# Patient Record
Sex: Female | Born: 1985 | Race: Black or African American | Hispanic: No | Marital: Single | Smoking: Never smoker
Health system: Southern US, Community
[De-identification: ages and names within clinical notes are randomized; demographics above are authoritative.]

## PROBLEM LIST (undated history)

## (undated) DIAGNOSIS — K810 Acute cholecystitis: Principal | ICD-10-CM

## (undated) HISTORY — PX: HERNIA REPAIR: SHX51

## (undated) HISTORY — PX: ABDOMINOPLASTY: SUR9

---
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
## 2010-10-09 ENCOUNTER — Emergency Department (HOSPITAL_BASED_OUTPATIENT_CLINIC_OR_DEPARTMENT_OTHER): Admission: EM | Admit: 2010-10-09 | Discharge: 2010-10-09 | Payer: Self-pay | Admitting: Emergency Medicine

## 2011-03-11 ENCOUNTER — Emergency Department (HOSPITAL_BASED_OUTPATIENT_CLINIC_OR_DEPARTMENT_OTHER)
Admission: EM | Admit: 2011-03-11 | Discharge: 2011-03-11 | Disposition: A | Discharge status: Home / Self Care | Payer: Self-pay | Attending: Emergency Medicine | Admitting: Emergency Medicine

## 2011-03-11 DIAGNOSIS — S335XXA Sprain of ligaments of lumbar spine, initial encounter: Secondary | ICD-10-CM | POA: Insufficient documentation

## 2011-03-11 DIAGNOSIS — X58XXXA Exposure to other specified factors, initial encounter: Secondary | ICD-10-CM | POA: Insufficient documentation

## 2011-03-11 LAB — URINALYSIS, ROUTINE W REFLEX MICROSCOPIC
Bilirubin Urine: NEGATIVE
Hgb urine dipstick: NEGATIVE
Ketones, ur: NEGATIVE mg/dL
Nitrite: NEGATIVE
Specific Gravity, Urine: 1.028 (ref 1.005–1.030)
Urobilinogen, UA: 0.2 mg/dL (ref 0.0–1.0)

## 2011-03-11 LAB — PREGNANCY, URINE: Preg Test, Ur: NEGATIVE

## 2011-11-01 ENCOUNTER — Emergency Department (HOSPITAL_BASED_OUTPATIENT_CLINIC_OR_DEPARTMENT_OTHER)
Admission: EM | Admit: 2011-11-01 | Discharge: 2011-11-01 | Disposition: A | Discharge status: Home / Self Care | Payer: Self-pay | Attending: Emergency Medicine | Admitting: Emergency Medicine

## 2011-11-01 ENCOUNTER — Encounter: Payer: Self-pay | Admitting: *Deleted

## 2011-11-01 ENCOUNTER — Emergency Department (INDEPENDENT_AMBULATORY_CARE_PROVIDER_SITE_OTHER): Payer: Self-pay

## 2011-11-01 DIAGNOSIS — R05 Cough: Secondary | ICD-10-CM | POA: Insufficient documentation

## 2011-11-01 DIAGNOSIS — R059 Cough, unspecified: Secondary | ICD-10-CM | POA: Insufficient documentation

## 2011-11-01 DIAGNOSIS — R0602 Shortness of breath: Secondary | ICD-10-CM | POA: Insufficient documentation

## 2011-11-01 MED ORDER — HYDROCOD POLST-CHLORPHEN POLST 10-8 MG/5ML PO LQCR: 5.0000 mL | Freq: Two times a day (BID) | ORAL | Status: DC

## 2011-11-01 NOTE — ED Provider Notes (Signed)
History     CSN: [id number] Arrival date & time: 11/01/2011  2:06 PM   First MD Initiated Contact with Patient 11/01/11 1443      Chief Complaint  Patient presents with  . Cough    (Consider location/radiation/quality/duration/timing/severity/associated sxs/prior treatment) HPI Comments: Pt states that she was seen and treated and symptoms continue:pt states that she want to make sure that she doesn't need antibiotics  Patient is a 25 y.o. female presenting with cough. The history is provided by the patient. No language interpreter was used.  Cough This is a new problem. The current episode started more than 1 week ago. The problem occurs constantly. The problem has not changed since onset.The cough is productive of sputum. There has been no fever. Associated symptoms include shortness of breath and wheezing. Treatments tried: prednisone and albuterol. She is not a smoker.    History reviewed. No pertinent past medical history.  History reviewed. No pertinent past surgical history.  History reviewed. No pertinent family history.  History  Substance Use Topics  . Smoking status: Never Smoker   . Smokeless tobacco: Not on file  . Alcohol Use: Yes    OB History    Grav Para Term Preterm Abortions TAB SAB Ect Mult Living                  Review of Systems  Respiratory: Positive for cough, shortness of breath and wheezing.   All other systems reviewed and are negative.    Allergies  Review of patient's allergies indicates no known allergies.  Home Medications   Current Outpatient Rx  Name Route Sig Dispense Refill  . ALBUTEROL SULFATE (2.5 MG/3ML) 0.083% IN NEBU Nebulization Take 2.5 mg by nebulization every 6 (six) hours as needed.      Marland Kitchen PREDNISONE (PAK) 10 MG PO TABS Oral Take 10 mg by mouth daily.        BP 124/75  Pulse 89  Temp(Src) 99.4 F (37.4 C) (Oral)  Resp 18  SpO2 100%  Physical Exam  Nursing note and vitals reviewed. Constitutional: She is  oriented to person, place, and time. She appears well-developed and well-nourished.  HENT:  Head: Normocephalic and atraumatic.  Left Ear: External ear normal.  Eyes: Pupils are equal, round, and reactive to light.  Cardiovascular: Normal rate and regular rhythm.   Pulmonary/Chest: Effort normal and breath sounds normal. No respiratory distress. She has no wheezes.  Musculoskeletal: Normal range of motion.  Neurological: She is alert and oriented to person, place, and time.  Skin: Skin is warm and dry.  Psychiatric: She has a normal mood and affect.    ED Course  Procedures (including critical care time)  Labs Reviewed - No data to display Dg Chest 2 View  11/01/2011  *RADIOLOGY REPORT*  Clinical Data: Cough for 3 weeks.  CHEST - 2 VIEW  Comparison: None.  Findings: Two views of the chest demonstrate clear lungs.  Few streaky densities along the posterior heart on the lateral view probably represents overlying structures.  Heart and mediastinum are within normal limits.  The trachea is midline.  Bony structures are intact.  IMPRESSION: No acute chest findings.  Original Report Authenticated By: Richarda Overlie, M.D.     1. Cough       MDM  Symptoms likely viral:pt in no acute distress:will treat symptomatically   Medical screening examination/treatment/procedure(s) were performed by non-physician practitioner and as supervising physician I was immediately available for consultation/collaboration. Jerold Coombe, M.D.  Teressa Lower, NP 11/01/11 1542  Carleene Cooper III, MD 11/01/11 [phone]

## 2011-11-01 NOTE — ED Notes (Signed)
Pt states she has had cough, congestion for 3 weeks. Took OTC meds without relief. Seen at clinic on Monday. Given Prednisone and Albuterol, but states she is no better.

## 2012-02-10 ENCOUNTER — Encounter (HOSPITAL_BASED_OUTPATIENT_CLINIC_OR_DEPARTMENT_OTHER): Payer: Self-pay | Admitting: Emergency Medicine

## 2012-02-10 ENCOUNTER — Emergency Department (HOSPITAL_BASED_OUTPATIENT_CLINIC_OR_DEPARTMENT_OTHER)
Admission: EM | Admit: 2012-02-10 | Discharge: 2012-02-10 | Disposition: A | Discharge status: Home / Self Care | Payer: Self-pay | Attending: Emergency Medicine | Admitting: Emergency Medicine

## 2012-02-10 DIAGNOSIS — K420 Umbilical hernia with obstruction, without gangrene: Secondary | ICD-10-CM | POA: Insufficient documentation

## 2012-02-10 LAB — URINALYSIS, ROUTINE W REFLEX MICROSCOPIC
Glucose, UA: NEGATIVE mg/dL
Hgb urine dipstick: NEGATIVE
Ketones, ur: NEGATIVE mg/dL
Leukocytes, UA: NEGATIVE
Protein, ur: NEGATIVE mg/dL
pH: 7 (ref 5.0–8.0)

## 2012-02-10 NOTE — ED Provider Notes (Signed)
History     CSN: [id number]  Arrival date & time 02/10/12  2113   First MD Initiated Contact with Patient 02/10/12 2211      Chief Complaint  Patient presents with  . Abdominal Pain    (Consider location/radiation/quality/duration/timing/severity/associated sxs/prior treatment) Patient is a 26 y.o. female presenting with abdominal pain. The history is provided by the patient.  Abdominal Pain The primary symptoms of the illness include abdominal pain. The primary symptoms of the illness do not include fever, nausea, vomiting, dysuria or vaginal discharge. The current episode started 1 to 2 hours ago. The onset of the illness was sudden. The problem has not changed since onset. The pain came on suddenly. The abdominal pain has been unchanged since its onset. Pain Location: Umbilical. She states that is a hard knot in her bellybutton. The abdominal pain does not radiate. The severity of the abdominal pain is 9/10. The abdominal pain is relieved by nothing.  The patient states that she believes she is currently not pregnant. Symptoms associated with the illness do not include anorexia or constipation. Significant associated medical issues do not include PUD, GERD, diabetes or gallstones.    History reviewed. No pertinent past medical history.  History reviewed. No pertinent past surgical history.  No family history on file.  History  Substance Use Topics  . Smoking status: Never Smoker   . Smokeless tobacco: Not on file  . Alcohol Use: Yes    OB History    Grav Para Term Preterm Abortions TAB SAB Ect Mult Living                  Review of Systems  Constitutional: Negative for fever.  Gastrointestinal: Positive for abdominal pain. Negative for nausea, vomiting, constipation and anorexia.  Genitourinary: Negative for dysuria and vaginal discharge.  All other systems reviewed and are negative.    Allergies  Review of patient's allergies indicates no known allergies.  Home  Medications   Current Outpatient Rx  Name Route Sig Dispense Refill  . LEVONORGESTREL 20 MCG/24HR IU IUD Intrauterine 1 each by Intrauterine route once. Inserted July 2010      BP 129/76  Pulse 90  Temp(Src) 98.4 F (36.9 C) (Oral)  Resp 18  Wt 162 lb (73.483 kg)  SpO2 100%  Physical Exam  Nursing note and vitals reviewed. Constitutional: She is oriented to person, place, and time. She appears well-developed and well-nourished. She appears distressed.  HENT:  Head: Normocephalic and atraumatic.  Eyes: EOM are normal. Pupils are equal, round, and reactive to light.  Pulmonary/Chest: Effort normal and breath sounds normal.  Abdominal: Soft. Bowel sounds are normal. She exhibits no distension. There is tenderness. There is no rebound and no guarding. A hernia is present.       Incarcerated umbilical hernia which was just with gentle pressure. Upon reduction all the pain resolved and no longer having abdominal  Musculoskeletal: Normal range of motion. She exhibits no tenderness.       No edema  Neurological: She is alert and oriented to person, place, and time. No cranial nerve deficit.  Skin: Skin is warm and dry. No rash noted.  Psychiatric: She has a normal mood and affect. Her behavior is normal.    ED Course  Hernia reduction Date/Time: 02/11/2012 12:19 AM Performed by: Gwyneth Sprout Authorized by: Gwyneth Sprout Consent: Verbal consent obtained. Written consent not obtained. Consent given by: patient Patient identity confirmed: verbally with patient Local anesthesia used: no Patient sedated: no  Patient tolerance: Patient tolerated the procedure well with no immediate complications. Comments: Umbilical hernia reduced and pain resolved   (including critical care time)   Labs Reviewed  PREGNANCY, URINE  URINALYSIS, ROUTINE W REFLEX MICROSCOPIC   No results found.   No diagnosis found.    MDM   Patient with incarcerated umbilical hernia. Hernia was  reduced and pain resolved. Patient was given followup with Gen. Surgery.        Gwyneth Sprout, MD 02/11/12 0020

## 2012-02-10 NOTE — ED Notes (Signed)
Pt c/o periumbilical pain x 1 hour. Pt denies n/v/d.

## 2012-07-25 ENCOUNTER — Inpatient Hospital Stay (HOSPITAL_BASED_OUTPATIENT_CLINIC_OR_DEPARTMENT_OTHER)
Admission: EM | Admit: 2012-07-25 | Discharge: 2012-07-26 | DRG: 494 | Disposition: A | Discharge status: Home / Self Care | Payer: BC Managed Care – PPO | Attending: Surgery | Admitting: Surgery

## 2012-07-25 ENCOUNTER — Emergency Department (HOSPITAL_BASED_OUTPATIENT_CLINIC_OR_DEPARTMENT_OTHER): Payer: BC Managed Care – PPO

## 2012-07-25 ENCOUNTER — Inpatient Hospital Stay (HOSPITAL_COMMUNITY): Payer: BC Managed Care – PPO | Admitting: Anesthesiology

## 2012-07-25 ENCOUNTER — Encounter (HOSPITAL_BASED_OUTPATIENT_CLINIC_OR_DEPARTMENT_OTHER): Payer: Self-pay | Admitting: Emergency Medicine

## 2012-07-25 ENCOUNTER — Inpatient Hospital Stay (HOSPITAL_COMMUNITY): Payer: BC Managed Care – PPO

## 2012-07-25 ENCOUNTER — Encounter (HOSPITAL_COMMUNITY): Admission: EM | Disposition: A | Discharge status: Home / Self Care | Payer: Self-pay | Source: Home / Self Care

## 2012-07-25 ENCOUNTER — Encounter (HOSPITAL_COMMUNITY): Payer: Self-pay | Admitting: Anesthesiology

## 2012-07-25 DIAGNOSIS — K8010 Calculus of gallbladder with chronic cholecystitis without obstruction: Secondary | ICD-10-CM

## 2012-07-25 DIAGNOSIS — B373 Candidiasis of vulva and vagina: Secondary | ICD-10-CM

## 2012-07-25 DIAGNOSIS — K429 Umbilical hernia without obstruction or gangrene: Secondary | ICD-10-CM | POA: Diagnosis present

## 2012-07-25 DIAGNOSIS — K810 Acute cholecystitis: Principal | ICD-10-CM

## 2012-07-25 HISTORY — PX: UMBILICAL HERNIA REPAIR: SHX196

## 2012-07-25 HISTORY — PX: CHOLECYSTECTOMY: SHX55

## 2012-07-25 HISTORY — DX: Acute cholecystitis: K81.0

## 2012-07-25 LAB — SURGICAL PCR SCREEN: Staphylococcus aureus: POSITIVE — AB

## 2012-07-25 LAB — URINALYSIS, ROUTINE W REFLEX MICROSCOPIC
Glucose, UA: NEGATIVE mg/dL
Nitrite: NEGATIVE
Specific Gravity, Urine: 1.03 (ref 1.005–1.030)
pH: 6 (ref 5.0–8.0)

## 2012-07-25 LAB — CBC WITH DIFFERENTIAL/PLATELET
Hemoglobin: 12.7 g/dL (ref 12.0–15.0)
Lymphocytes Relative: 25 % (ref 12–46)
Lymphs Abs: 3.4 10*3/uL (ref 0.7–4.0)
Monocytes Relative: 9 % (ref 3–12)
Neutro Abs: 8.9 10*3/uL — ABNORMAL HIGH (ref 1.7–7.7)
Neutrophils Relative %: 65 % (ref 43–77)
Platelets: 301 10*3/uL (ref 150–400)
RBC: 4.37 MIL/uL (ref 3.87–5.11)
WBC: 13.7 10*3/uL — ABNORMAL HIGH (ref 4.0–10.5)

## 2012-07-25 LAB — COMPREHENSIVE METABOLIC PANEL
ALT: 18 U/L (ref 0–35)
Alkaline Phosphatase: 68 U/L (ref 39–117)
CO2: 26 mEq/L (ref 19–32)
Chloride: 101 mEq/L (ref 96–112)
GFR calc Af Amer: >90 mL/min (ref >90)
GFR calc non Af Amer: 89 mL/min — ABNORMAL LOW (ref >90)
Glucose, Bld: 105 mg/dL — ABNORMAL HIGH (ref 70–99)
Potassium: 4 mEq/L (ref 3.5–5.1)
Sodium: 137 mEq/L (ref 135–145)
Total Bilirubin: 0.2 mg/dL — ABNORMAL LOW (ref 0.3–1.2)
Total Protein: 7.8 g/dL (ref 6.0–8.3)

## 2012-07-25 LAB — URINE MICROSCOPIC-ADD ON

## 2012-07-25 LAB — WET PREP, GENITAL
Clue Cells Wet Prep HPF POC: NONE SEEN
Trich, Wet Prep: NONE SEEN

## 2012-07-25 SURGERY — LAPAROSCOPIC CHOLECYSTECTOMY WITH INTRAOPERATIVE CHOLANGIOGRAM
Anesthesia: General | Site: Abdomen | Wound class: Clean Contaminated

## 2012-07-25 MED ORDER — MORPHINE SULFATE 2 MG/ML IJ SOLN: 1.0000 mg | INTRAMUSCULAR | Status: DC | PRN

## 2012-07-25 MED ORDER — DEXAMETHASONE SODIUM PHOSPHATE 10 MG/ML IJ SOLN
INTRAMUSCULAR | Status: DC | PRN
  Administered 2012-07-25: 10 mg via INTRAVENOUS

## 2012-07-25 MED ORDER — ONDANSETRON HCL 4 MG/2ML IJ SOLN: 4.0000 mg | Freq: Four times a day (QID) | INTRAMUSCULAR | Status: DC | PRN

## 2012-07-25 MED ORDER — IOHEXOL 300 MG/ML  SOLN
20.0000 mL | INTRAMUSCULAR | Status: DC
  Administered 2012-07-25: 20 mL via ORAL

## 2012-07-25 MED ORDER — ONDANSETRON HCL 4 MG/2ML IJ SOLN
INTRAMUSCULAR | Status: DC | PRN
  Administered 2012-07-25: 4 mg via INTRAVENOUS

## 2012-07-25 MED ORDER — CISATRACURIUM BESYLATE (PF) 10 MG/5ML IV SOLN
INTRAVENOUS | Status: DC | PRN
  Administered 2012-07-25: 8 mg via INTRAVENOUS

## 2012-07-25 MED ORDER — GLYCOPYRROLATE 0.2 MG/ML IJ SOLN
INTRAMUSCULAR | Status: DC | PRN
  Administered 2012-07-25: .5 mg via INTRAVENOUS

## 2012-07-25 MED ORDER — SODIUM CHLORIDE 0.9 % IV SOLN
INTRAVENOUS | Status: DC
  Administered 2012-07-25: 05:00:00 via INTRAVENOUS

## 2012-07-25 MED ORDER — HYDROMORPHONE HCL PF 1 MG/ML IJ SOLN
0.2500 mg | INTRAMUSCULAR | Status: DC | PRN
  Administered 2012-07-25 – 2012-07-26 (×5): 0.5 mg via INTRAVENOUS
  Filled 2012-07-25 (×3): qty 1

## 2012-07-25 MED ORDER — BUPIVACAINE HCL (PF) 0.25 % IJ SOLN
INTRAMUSCULAR | Status: DC | PRN
  Administered 2012-07-25: 15 mL

## 2012-07-25 MED ORDER — SODIUM CHLORIDE 0.9 % IR SOLN
Status: DC | PRN
  Administered 2012-07-25: 1000 mL

## 2012-07-25 MED ORDER — IOHEXOL 300 MG/ML  SOLN
100.0000 mL | Freq: Once | INTRAMUSCULAR | Status: AC | PRN
  Administered 2012-07-25: 100 mL via INTRAVENOUS

## 2012-07-25 MED ORDER — LIDOCAINE-EPINEPHRINE 1 %-1:100000 IJ SOLN
INTRAMUSCULAR | Status: AC
  Filled 2012-07-25: qty 1

## 2012-07-25 MED ORDER — LEVOFLOXACIN IN D5W 500 MG/100ML IV SOLN
500.0000 mg | INTRAVENOUS | Status: DC
  Administered 2012-07-25: 500 mg via INTRAVENOUS
  Filled 2012-07-25 (×2): qty 100

## 2012-07-25 MED ORDER — MIDAZOLAM HCL 5 MG/5ML IJ SOLN
INTRAMUSCULAR | Status: DC | PRN
  Administered 2012-07-25: 2 mg via INTRAVENOUS

## 2012-07-25 MED ORDER — ONDANSETRON HCL 4 MG/2ML IJ SOLN
4.0000 mg | Freq: Once | INTRAMUSCULAR | Status: AC
  Administered 2012-07-25: 4 mg via INTRAVENOUS
  Filled 2012-07-25: qty 2

## 2012-07-25 MED ORDER — PROMETHAZINE HCL 25 MG/ML IJ SOLN: 6.2500 mg | INTRAMUSCULAR | Status: DC | PRN

## 2012-07-25 MED ORDER — LIDOCAINE-EPINEPHRINE (PF) 1 %-1:200000 IJ SOLN
INTRAMUSCULAR | Status: DC | PRN
  Administered 2012-07-25: 15 mL

## 2012-07-25 MED ORDER — SODIUM CHLORIDE 0.9 % IV SOLN
INTRAVENOUS | Status: DC | PRN
  Administered 2012-07-25: 18:00:00 via INTRAVENOUS

## 2012-07-25 MED ORDER — BUPIVACAINE HCL (PF) 0.25 % IJ SOLN
INTRAMUSCULAR | Status: AC
  Filled 2012-07-25: qty 30

## 2012-07-25 MED ORDER — IOHEXOL 300 MG/ML  SOLN
INTRAMUSCULAR | Status: AC
  Filled 2012-07-25: qty 1

## 2012-07-25 MED ORDER — KCL IN DEXTROSE-NACL 20-5-0.45 MEQ/L-%-% IV SOLN
INTRAVENOUS | Status: DC
  Filled 2012-07-25 (×3): qty 1000

## 2012-07-25 MED ORDER — LACTATED RINGERS IV SOLN
INTRAVENOUS | Status: DC | PRN
  Administered 2012-07-25: 18:00:00 via INTRAVENOUS

## 2012-07-25 MED ORDER — PROPOFOL 10 MG/ML IV EMUL
INTRAVENOUS | Status: DC | PRN
  Administered 2012-07-25: 150 mg via INTRAVENOUS

## 2012-07-25 MED ORDER — FENTANYL CITRATE 0.05 MG/ML IJ SOLN
INTRAMUSCULAR | Status: DC | PRN
  Administered 2012-07-25: 50 ug via INTRAVENOUS
  Administered 2012-07-25: 100 ug via INTRAVENOUS

## 2012-07-25 MED ORDER — LIP MEDEX EX OINT
TOPICAL_OINTMENT | CUTANEOUS | Status: AC
  Administered 2012-07-25: 22:00:00
  Filled 2012-07-25: qty 7

## 2012-07-25 MED ORDER — FENTANYL CITRATE 0.05 MG/ML IJ SOLN
100.0000 ug | Freq: Once | INTRAMUSCULAR | Status: AC
  Administered 2012-07-25: 100 ug via INTRAVENOUS
  Filled 2012-07-25: qty 2

## 2012-07-25 MED ORDER — KCL IN DEXTROSE-NACL 20-5-0.45 MEQ/L-%-% IV SOLN
INTRAVENOUS | Status: DC
  Administered 2012-07-25 – 2012-07-26 (×2): via INTRAVENOUS
  Filled 2012-07-25 (×3): qty 1000

## 2012-07-25 MED ORDER — MORPHINE SULFATE 2 MG/ML IJ SOLN
2.0000 mg | INTRAMUSCULAR | Status: DC | PRN
Start: 2012-07-25 — End: 2012-07-25

## 2012-07-25 MED ORDER — FENTANYL CITRATE 0.05 MG/ML IJ SOLN: 100.0000 ug | INTRAMUSCULAR | Status: DC | PRN

## 2012-07-25 MED ORDER — NEOSTIGMINE METHYLSULFATE 1 MG/ML IJ SOLN
INTRAMUSCULAR | Status: DC | PRN
  Administered 2012-07-25: 4 mg via INTRAVENOUS

## 2012-07-25 MED ORDER — HYDROCODONE-ACETAMINOPHEN 5-325 MG PO TABS: 1.0000 | ORAL_TABLET | Freq: Four times a day (QID) | ORAL | Status: DC | PRN

## 2012-07-25 MED ORDER — FLUCONAZOLE 50 MG PO TABS
150.0000 mg | ORAL_TABLET | Freq: Once | ORAL | Status: AC
  Administered 2012-07-25: 150 mg via ORAL
  Filled 2012-07-25: qty 1

## 2012-07-25 MED ORDER — LACTATED RINGERS IR SOLN
Status: DC | PRN
  Administered 2012-07-25: 1000 mL

## 2012-07-25 SURGICAL SUPPLY — 37 items
APPLICATOR COTTON TIP 6IN STRL (MISCELLANEOUS) IMPLANT
APPLIER CLIP LOGIC TI 5 (MISCELLANEOUS) ×2 IMPLANT
APPLIER CLIP ROT 10 11.4 M/L (STAPLE) ×2
CABLE HIGH FREQUENCY MONO STRZ (ELECTRODE) ×2 IMPLANT
CANISTER SUCTION 2500CC (MISCELLANEOUS) ×2 IMPLANT
CHLORAPREP W/TINT 26ML (MISCELLANEOUS) ×2 IMPLANT
CLIP APPLIE ROT 10 11.4 M/L (STAPLE) ×1 IMPLANT
CLOTH BEACON ORANGE TIMEOUT ST (SAFETY) ×2 IMPLANT
COVER SURGICAL LIGHT HANDLE (MISCELLANEOUS) ×2 IMPLANT
DECANTER SPIKE VIAL GLASS SM (MISCELLANEOUS) ×2 IMPLANT
DERMABOND ADVANCED (GAUZE/BANDAGES/DRESSINGS) ×1
DERMABOND ADVANCED .7 DNX12 (GAUZE/BANDAGES/DRESSINGS) ×1 IMPLANT
DRAPE C-ARM 42X72 X-RAY (DRAPES) ×2 IMPLANT
DRAPE LAPAROSCOPIC ABDOMINAL (DRAPES) ×2 IMPLANT
ELECT REM PT RETURN 9FT ADLT (ELECTROSURGICAL) ×2
ELECTRODE REM PT RTRN 9FT ADLT (ELECTROSURGICAL) ×1 IMPLANT
ENDOLOOP SUT PDS II  0 18 (SUTURE)
ENDOLOOP SUT PDS II 0 18 (SUTURE) IMPLANT
GLOVE SURG SS PI 7.5 STRL IVOR (GLOVE) ×4 IMPLANT
GOWN STRL NON-REIN LRG LVL3 (GOWN DISPOSABLE) ×2 IMPLANT
GOWN STRL REIN XL XLG (GOWN DISPOSABLE) ×4 IMPLANT
KIT BASIN OR (CUSTOM PROCEDURE TRAY) ×2 IMPLANT
NS IRRIG 1000ML POUR BTL (IV SOLUTION) ×2 IMPLANT
PENCIL BUTTON HOLSTER BLD 10FT (ELECTRODE) ×2 IMPLANT
POUCH SPECIMEN RETRIEVAL 10MM (ENDOMECHANICALS) ×2 IMPLANT
SCISSORS LAP 5X35 DISP (ENDOMECHANICALS) ×2 IMPLANT
SET CHOLANGIOGRAPH MIX (MISCELLANEOUS) ×2 IMPLANT
SET IRRIG TUBING LAPAROSCOPIC (IRRIGATION / IRRIGATOR) ×2 IMPLANT
STRIP CLOSURE SKIN 1/2X4 (GAUZE/BANDAGES/DRESSINGS) IMPLANT
SUT MNCRL AB 4-0 PS2 18 (SUTURE) ×4 IMPLANT
SUT VICRYL 0 UR6 27IN ABS (SUTURE) ×4 IMPLANT
TOWEL OR 17X26 10 PK STRL BLUE (TOWEL DISPOSABLE) ×4 IMPLANT
TRAY LAP CHOLE (CUSTOM PROCEDURE TRAY) ×2 IMPLANT
TROCAR BALLN 12MMX100 BLUNT (TROCAR) ×2 IMPLANT
TROCAR BLADELESS OPT 5 75 (ENDOMECHANICALS) ×4 IMPLANT
TROCAR XCEL NON-BLD 11X100MML (ENDOMECHANICALS) ×2 IMPLANT
TUBING INSUFFLATION 10FT LAP (TUBING) ×2 IMPLANT

## 2012-07-25 NOTE — ED Notes (Signed)
States she knows she has a known umbilical hernia and has hx of low back spasms.  C/o pain last night around 10pm in area of hernia and low back.  Denies any UTI sx's or vaginal d/c.

## 2012-07-25 NOTE — H&P (Signed)
Reason for Consult:cholecystitis Referring Physician:  Ghim  Linda Pena is an 26 y.o. female.  HPI: this patient was in her usual state of health until approximately 10:00 last night she began having back pain and periumbilical pain. She described this as a "bad ache" the severity of 9 and 10. She thought this would go away but it persisted and she presented to med center [facility] for evaluation. She had some nausea while at the emergency room as well as an episode of vomiting but she was not feeling nauseated previously. The ordered a CT scan of the abdomen which demonstrated cholelithiasis with some gallbladder wall thickening and some pericholecystic fluid and a followup ultrasound was obtained which confirmed the diagnosis. She says that she has not been having abdominal pain and has not noticed any change with bleeding. She says her bowels are normal  And denies any blood in the stool or melena. She denies any fevers or chills or urinary symptoms.  History reviewed. No pertinent past medical history.  History reviewed. No pertinent past surgical history.  No family history on file.  Social History:  reports that she has never smoked. She does not have any smokeless tobacco history on file. She reports that she drinks alcohol. She reports that she does not use illicit drugs. she does smoke drugs about 2/week and she drinks on the weekends  Allergies: No Known Allergies  Medications: I have reviewed the patient's current medications.  Results for orders placed during the hospital encounter of 07/25/12 (from the past 48 hour(s))  URINALYSIS, ROUTINE W REFLEX MICROSCOPIC     Status: Abnormal   Collection Time   07/25/12  5:05 AM      Component Value Range Comment   Color, Urine YELLOW  YELLOW    APPearance CLEAR  CLEAR    Specific Gravity, Urine 1.030  1.005 - 1.030    pH 6.0  5.0 - 8.0    Glucose, UA NEGATIVE  NEGATIVE mg/dL    Hgb urine dipstick NEGATIVE  NEGATIVE    Bilirubin  Urine NEGATIVE  NEGATIVE    Ketones, ur NEGATIVE  NEGATIVE mg/dL    Protein, ur NEGATIVE  NEGATIVE mg/dL    Urobilinogen, UA 0.2  0.0 - 1.0 mg/dL    Nitrite NEGATIVE  NEGATIVE    Leukocytes, UA SMALL (*) NEGATIVE   PREGNANCY, URINE     Status: Normal   Collection Time   07/25/12  5:05 AM      Component Value Range Comment   Preg Test, Ur NEGATIVE  NEGATIVE   URINE MICROSCOPIC-ADD ON     Status: Abnormal   Collection Time   07/25/12  5:05 AM      Component Value Range Comment   Squamous Epithelial / LPF FEW (*) RARE    WBC, UA 0-2  <3 WBC/hpf    RBC / HPF 0-2  <3 RBC/hpf    Bacteria, UA FEW (*) RARE    Urine-Other MUCOUS PRESENT     CBC WITH DIFFERENTIAL     Status: Abnormal   Collection Time   07/25/12  5:13 AM      Component Value Range Comment   WBC 13.7 (*) 4.0 - 10.5 K/uL    RBC 4.37  3.87 - 5.11 MIL/uL    Hemoglobin 12.7  12.0 - 15.0 g/dL    HCT 16.1  09.6 - 04.5 %    MCV 85.8  78.0 - 100.0 fL    MCH 29.1  26.0 -  34.0 pg    MCHC 33.9  30.0 - 36.0 g/dL    RDW 29.5  62.1 - 30.8 %    Platelets 301  150 - 400 K/uL    Neutrophils Relative 65  43 - 77 %    Neutro Abs 8.9 (*) 1.7 - 7.7 K/uL    Lymphocytes Relative 25  12 - 46 %    Lymphs Abs 3.4  0.7 - 4.0 K/uL    Monocytes Relative 9  3 - 12 %    Monocytes Absolute 1.2 (*) 0.1 - 1.0 K/uL    Eosinophils Relative 1  0 - 5 %    Eosinophils Absolute 0.2  0.0 - 0.7 K/uL    Basophils Relative 0  0 - 1 %    Basophils Absolute 0.0  0.0 - 0.1 K/uL   COMPREHENSIVE METABOLIC PANEL     Status: Abnormal   Collection Time   07/25/12  5:13 AM      Component Value Range Comment   Sodium 137  135 - 145 mEq/L    Potassium 4.0  3.5 - 5.1 mEq/L    Chloride 101  96 - 112 mEq/L    CO2 26  19 - 32 mEq/L    Glucose, Bld 105 (*) 70 - 99 mg/dL    BUN 17  6 - 23 mg/dL    Creatinine, Ser 6.57  0.50 - 1.10 mg/dL    Calcium 9.2  8.4 - 84.6 mg/dL    Total Protein 7.8  6.0 - 8.3 g/dL    Albumin 4.1  3.5 - 5.2 g/dL    AST 20  0 - 37 U/L    ALT 18   0 - 35 U/L    Alkaline Phosphatase 68  39 - 117 U/L    Total Bilirubin 0.2 (*) 0.3 - 1.2 mg/dL    GFR calc non Af Amer 89 (*) >90 mL/min    GFR calc Af Amer >90  >90 mL/min   LIPASE, BLOOD     Status: Normal   Collection Time   07/25/12  5:13 AM      Component Value Range Comment   Lipase 42  11 - 59 U/L   WET PREP, GENITAL     Status: Abnormal   Collection Time   07/25/12  5:53 AM      Component Value Range Comment   Yeast Wet Prep HPF POC MANY (*) NONE SEEN    Trich, Wet Prep NONE SEEN  NONE SEEN    Clue Cells Wet Prep HPF POC NONE SEEN  NONE SEEN    WBC, Wet Prep HPF POC MANY (*) NONE SEEN     US Abdomen Complete  07/25/2012  *RADIOLOGY REPORT*  Clinical Data:  26 year old female with abdominal pain.  Abnormal- appearing gallbladder on recent CT.  ABDOMINAL ULTRASOUND COMPLETE  Comparison:  07/25/2012 CT  Findings:  Gallbladder:  A 1.7 cm gallstone at the gallbladder neck is noted. Gallbladder wall thickening is present without pericholecystic fluid.  An equivocal sonographic Murphy's sign is noted.  Common Bile Duct:  There is no evidence of intrahepatic or extrahepatic biliary dilation. The CBD measures 2.9 mm in greatest diameter.  Liver:  The liver is within normal limits in parenchymal echogenicity. No focal abnormalities are identified.  IVC:  Appears normal.  Pancreas:  Although the pancreas is difficult to visualize in its entirety, no focal pancreatic abnormality is identified.  Spleen:  Within normal limits in size and echotexture.  Right  kidney:  The right kidney is normal in size and parenchymal echogenicity.  There is no evidence of solid mass, hydronephrosis or definite renal calculi.  The right kidney measures 9.5 cm.  Left kidney:  The left kidney is normal in size and parenchymal echogenicity.  There is no evidence of solid mass, hydronephrosis or definite renal calculi.   The left kidney measures 9.9 cm.  Abdominal Aorta:  No abdominal aortic aneurysm identified.  There is no  evidence of ascites.  IMPRESSION: 1.7 cm gallstone at the gallbladder neck with gallbladder wall thickening - likely representing acute cholecystitis. No evidence of biliary dilatation.  No other abnormalities identified.   Original Report Authenticated By: Rosendo Gros, M.D.    Ct Abdomen Pelvis W Contrast  07/25/2012  *RADIOLOGY REPORT*  Clinical Data: Abdominal and back pain.  CT ABDOMEN AND PELVIS WITH CONTRAST  Technique:  Multidetector CT imaging of the abdomen and pelvis was performed following the standard protocol during bolus administration of intravenous contrast.  Contrast: OMNIPAQUE IOHEXOL 300 MG/ML  SOLN  Comparison: None.  Findings: The lung bases are clear.  There is no evidence for free intraperitoneal air.  The gallbladder is moderately distended with fluid around the gallbladder.  There is a dense calcification near the gallbladder neck consistent with a stone. Normal appearance of liver and portal venous system.  Pancreas has a normal appearance without pancreatic duct dilatation.  Normal appearance of the adrenal tissue and kidneys.  Spleen has a normal appearance.  The patient has an IUD. No gross abnormality to the uterus or adnexal tissue.  No significant pelvic free fluid.  No acute bony abnormality.  IMPRESSION: The gallbladder is moderately distended with surrounding edema and a calcified stone near the gallbladder neck.  Findings are concerning for acute cholecystitis.   Original Report Authenticated By: Richarda Overlie, M.D.    All other review of systems negative or noncontributory except as stated in the HPI  Blood pressure 97/57, pulse 70, temperature 98.3 F (36.8 C), temperature source Oral, resp. rate 16, height 5\' 1"  (1.549 m), weight 162 lb (73.483 kg), SpO2 100.00%. General appearance: alert, cooperative and no distress Head: Normocephalic, without obvious abnormality, atraumatic Neck: no JVD and supple, symmetrical, trachea midline Resp: clear to auscultation  bilaterally Cardio: normal rate, regular GI: soft, non-tender; bowel sounds normal; no masses,  no organomegaly and no Murphy's sign, no peritoneal signs.  small reducible umbilical hernia  Extremities: extremities normal, atraumatic, no cyanosis or edema Pulses: 2+ and symmetric Skin: Skin color, texture, turgor normal. No rashes or lesions Neurologic: Grossly normal  Assessment/Plan: Cholelithiasis and possible cholecystitis Donor history and physical exam are not really consistent with acute cholecystitis, she has CT and ultrasound findings consistent with acute cholecystitis. This all started with back pain and some abdominal pain which certainly could be related to cholelithiasis. Currently she is not having a discomfort. She does have a white blood cell count of 13.7 and pericholecystic fluid as well as cholelithiasis. I explained to her that note her symptoms are not consistent with cholecystitis and are certainly atypical, given the findings on imaging as well as her elevated white blood cell count and known cholelithiasis, I think it is reasonable to proceed with cholecystectomy. We discussed the procedure and its risks. The risks of infection, bleeding, pain, persistent symptoms, scarring, injury to bowel or bile ducts, retained stone, diarrhea, need for additional procedures, and need for open surgery discussed with the patient. She expressed understanding and would like  to proceed with cholecystectomy when available. In the meantime, we will admit her to the hospital and keep her n.p.o. With IV fluids and start antibiotic management.   Lodema Pilot DAVID 07/25/2012, 2:45 PM

## 2012-07-25 NOTE — ED Provider Notes (Signed)
History     CSN: [id number]  Arrival date & time 07/25/12  0456   First MD Initiated Contact with Patient 07/25/12 [phone]      Chief Complaint  Patient presents with  . Back Pain    (Consider location/radiation/quality/duration/timing/severity/associated sxs/prior treatment) HPI This is a 26 year old black female with about a 7 hour history of mid lower back pain. The pain has become severe. She describes it as like spasms. It is not altered with movement and improved with lying still. She has had nausea but no vomiting due to the pain. She is also having suprapubic and periumbilical pain. She has a known umbilical hernia which she can normally reduce; she states that there is no palpable hernia to reduce at this time. She denies fever or chills. She denies diarrhea, dysuria, hematuria, vaginal bleeding or discharge.  History reviewed. No pertinent past medical history.  History reviewed. No pertinent past surgical history.  No family history on file.  History  Substance Use Topics  . Smoking status: Never Smoker   . Smokeless tobacco: Not on file  . Alcohol Use: Yes    OB History    Grav Para Term Preterm Abortions TAB SAB Ect Mult Living                  Review of Systems  All other systems reviewed and are negative.    Allergies  Review of patient's allergies indicates no known allergies.  Home Medications   Current Outpatient Rx  Name Route Sig Dispense Refill  . LEVONORGESTREL 20 MCG/24HR IU IUD Intrauterine 1 each by Intrauterine route once. Inserted July 2010      BP 115/68  Pulse 79  Temp 98 F (36.7 C) (Oral)  Resp 16  Ht 5\' 1"  (1.549 m)  Wt 162 lb (73.483 kg)  BMI 30.61 kg/m2  SpO2 100%  Physical Exam General: Well-developed, well-nourished female in no acute distress; appearance consistent with age of record HENT: normocephalic, atraumatic Eyes: pupils equal round and reactive to light; extraocular muscles intact Neck: supple Heart: regular  rate and rhythm Lungs: clear to auscultation bilaterally Abdomen: soft; nondistended; mild periumbilical and suprapubic tenderness; no masses or hepatosplenomegaly; bowel sounds present; no umbilical hernia palpated GU: Normal external genitalia; no vaginal bleeding; curd-like vaginal discharge; cervical os closed; no cervical motion tenderness; no adnexal tenderness; no adnexal mass palpated Back: No lumbar tenderness Extremities: No deformity; full range of motion Neurologic: Awake, alert and oriented; motor function intact in all extremities and symmetric; no facial droop Skin: Warm and dry    ED Course  Procedures (including critical care time)     MDM   Nursing notes and vitals signs, including pulse oximetry, reviewed.  Summary of this visit's results, reviewed by myself:  Labs:  Results for orders placed during the hospital encounter of 07/25/12  URINALYSIS, ROUTINE W REFLEX MICROSCOPIC      Component Value Range   Color, Urine YELLOW  YELLOW   APPearance CLEAR  CLEAR   Specific Gravity, Urine 1.030  1.005 - 1.030   pH 6.0  5.0 - 8.0   Glucose, UA NEGATIVE  NEGATIVE mg/dL   Hgb urine dipstick NEGATIVE  NEGATIVE   Bilirubin Urine NEGATIVE  NEGATIVE   Ketones, ur NEGATIVE  NEGATIVE mg/dL   Protein, ur NEGATIVE  NEGATIVE mg/dL   Urobilinogen, UA 0.2  0.0 - 1.0 mg/dL   Nitrite NEGATIVE  NEGATIVE   Leukocytes, UA SMALL (*) NEGATIVE  PREGNANCY, URINE  Component Value Range   Preg Test, Ur NEGATIVE  NEGATIVE  CBC WITH DIFFERENTIAL      Component Value Range   WBC 13.7 (*) 4.0 - 10.5 K/uL   RBC 4.37  3.87 - 5.11 MIL/uL   Hemoglobin 12.7  12.0 - 15.0 g/dL   HCT 16.1  09.6 - 04.5 %   MCV 85.8  78.0 - 100.0 fL   MCH 29.1  26.0 - 34.0 pg   MCHC 33.9  30.0 - 36.0 g/dL   RDW 40.9  81.1 - 91.4 %   Platelets 301  150 - 400 K/uL   Neutrophils Relative 65  43 - 77 %   Neutro Abs 8.9 (*) 1.7 - 7.7 K/uL   Lymphocytes Relative 25  12 - 46 %   Lymphs Abs 3.4  0.7 - 4.0  K/uL   Monocytes Relative 9  3 - 12 %   Monocytes Absolute 1.2 (*) 0.1 - 1.0 K/uL   Eosinophils Relative 1  0 - 5 %   Eosinophils Absolute 0.2  0.0 - 0.7 K/uL   Basophils Relative 0  0 - 1 %   Basophils Absolute 0.0  0.0 - 0.1 K/uL  COMPREHENSIVE METABOLIC PANEL      Component Value Range   Sodium 137  135 - 145 mEq/L   Potassium 4.0  3.5 - 5.1 mEq/L   Chloride 101  96 - 112 mEq/L   CO2 26  19 - 32 mEq/L   Glucose, Bld 105 (*) 70 - 99 mg/dL   BUN 17  6 - 23 mg/dL   Creatinine, Ser 7.82  0.50 - 1.10 mg/dL   Calcium 9.2  8.4 - 95.6 mg/dL   Total Protein 7.8  6.0 - 8.3 g/dL   Albumin 4.1  3.5 - 5.2 g/dL   AST 20  0 - 37 U/L   ALT 18  0 - 35 U/L   Alkaline Phosphatase 68  39 - 117 U/L   Total Bilirubin 0.2 (*) 0.3 - 1.2 mg/dL   GFR calc non Af Amer 89 (*) >90 mL/min   GFR calc Af Amer >90  >90 mL/min  LIPASE, BLOOD      Component Value Range   Lipase 42  11 - 59 U/L  URINE MICROSCOPIC-ADD ON      Component Value Range   Squamous Epithelial / LPF FEW (*) RARE   WBC, UA 0-2  <3 WBC/hpf   RBC / HPF 0-2  <3 RBC/hpf   Bacteria, UA FEW (*) RARE   Urine-Other MUCOUS PRESENT    WET PREP, GENITAL      Component Value Range   Yeast Wet Prep HPF POC MANY (*) NONE SEEN   Trich, Wet Prep NONE SEEN  NONE SEEN   Clue Cells Wet Prep HPF POC NONE SEEN  NONE SEEN   WBC, Wet Prep HPF POC MANY (*) NONE SEEN    Imaging Studies: Ct Abdomen Pelvis W Contrast  07/25/2012  *RADIOLOGY REPORT*  Clinical Data: Abdominal and back pain.  CT ABDOMEN AND PELVIS WITH CONTRAST  Technique:  Multidetector CT imaging of the abdomen and pelvis was performed following the standard protocol during bolus administration of intravenous contrast.  Contrast: OMNIPAQUE IOHEXOL 300 MG/ML  SOLN  Comparison: None.  Findings: The lung bases are clear.  There is no evidence for free intraperitoneal air.  The gallbladder is moderately distended with fluid around the gallbladder.  There is a dense calcification near the  gallbladder neck  consistent with a stone. Normal appearance of liver and portal venous system.  Pancreas has a normal appearance without pancreatic duct dilatation.  Normal appearance of the adrenal tissue and kidneys.  Spleen has a normal appearance.  The patient has an IUD. No gross abnormality to the uterus or adnexal tissue.  No significant pelvic free fluid.  No acute bony abnormality.  IMPRESSION: The gallbladder is moderately distended with surrounding edema and a calcified stone near the gallbladder neck.  Findings are concerning for acute cholecystitis.   Original Report Authenticated By: Richarda Overlie, M.D.    7:33 AM Patient asymptomatic at this time was soft nontender abdomen. Patient's history and exam are not consistent with acute cholecystitis but do to CT findings we will obtain an abdominal ultrasound. Dr. Oletta Lamas follow up on the results of this and make disposition.         Hanley Seamen, MD 07/25/12 [phone]

## 2012-07-25 NOTE — Brief Op Note (Signed)
07/25/2012  7:24 PM  PATIENT:  Linda Pena  26 y.o. female  PRE-OPERATIVE DIAGNOSIS:  Cholecystitis  POST-OPERATIVE DIAGNOSIS:  Cholecystitis  PROCEDURE:  Procedure(s) (LRB): LAPAROSCOPIC CHOLECYSTECTOMY WITH INTRAOPERATIVE CHOLANGIOGRAM (N/A) HERNIA REPAIR UMBILICAL ADULT (N/A)  SURGEON:  Surgeon(s) and Role:    * Lodema Pilot, DO - Primary  PHYSICIAN ASSISTANT:   ASSISTANTS: none   ANESTHESIA:   general  EBL:  Total I/O In: -  Out: 200 [Urine:200]  BLOOD ADMINISTERED:none  DRAINS: none   LOCAL MEDICATIONS USED:  MARCAINE    and LIDOCAINE   SPECIMEN:  Source of Specimen:  gallbladder  DISPOSITION OF SPECIMEN:  PATHOLOGY  COUNTS:  YES  TOURNIQUET:  * No tourniquets in log *  DICTATION: .Other Dictation: Dictation Number [id number]  PLAN OF CARE: Admit for overnight observation  PATIENT DISPOSITION:  PACU - hemodynamically stable.   Delay start of Pharmacological VTE agent (>24hrs) due to surgical blood loss or risk of bleeding: no

## 2012-07-25 NOTE — ED Provider Notes (Signed)
Continues to have minimal abd pain, no nausea since early this AM.  U/S again shows impacted stone in GB neck and findings suggesting acute cholecystitis.  Spoke to Dr. Biagio Quint who accepts at [facility] ED.  [facility] to transfer, pt is agreeable.    Gavin Pound. Vernal Rutan, MD 07/25/12 1137

## 2012-07-25 NOTE — Anesthesia Preprocedure Evaluation (Signed)
Anesthesia Evaluation  Patient identified by MRN, date of birth, ID band Patient awake    Reviewed: Allergy & Precautions, H&P , NPO status , Patient's Chart, lab work & pertinent test results  Airway Mallampati: II TM Distance: >3 FB Neck ROM: Full    Dental No notable dental hx.    Pulmonary neg pulmonary ROS,  breath sounds clear to auscultation  Pulmonary exam normal       Cardiovascular negative cardio ROS  Rhythm:Regular Rate:Normal     Neuro/Psych negative neurological ROS  negative psych ROS   GI/Hepatic negative GI ROS, Neg liver ROS,   Endo/Other  negative endocrine ROS  Renal/GU negative Renal ROS  negative genitourinary   Musculoskeletal negative musculoskeletal ROS (+)   Abdominal (+) + obese,   Peds negative pediatric ROS (+)  Hematology negative hematology ROS (+)   Anesthesia Other Findings   Reproductive/Obstetrics negative OB ROS                           Anesthesia Physical Anesthesia Plan  ASA: II  Anesthesia Plan: General   Post-op Pain Management:    Induction: Intravenous  Airway Management Planned: Oral ETT  Additional Equipment:   Intra-op Plan:   Post-operative Plan: Extubation in OR  Informed Consent: I have reviewed the patients History and Physical, chart, labs and discussed the procedure including the risks, benefits and alternatives for the proposed anesthesia with the patient or authorized representative who has indicated his/her understanding and acceptance.   Dental advisory given  Plan Discussed with: CRNA  Anesthesia Plan Comments:         Anesthesia Quick Evaluation  

## 2012-07-25 NOTE — ED Notes (Signed)
[id number]<JY> Expected date:<BR> Expected time:<BR> Means of arrival:<BR> Comments:<BR> Transfer from [facility]

## 2012-07-25 NOTE — Transfer of Care (Signed)
Immediate Anesthesia Transfer of Care Note  Patient: Katniss Slivinski  Procedure(s) Performed: Procedure(s) (LRB): LAPAROSCOPIC CHOLECYSTECTOMY WITH INTRAOPERATIVE CHOLANGIOGRAM (N/A) HERNIA REPAIR UMBILICAL ADULT (N/A)  Patient Location: PACU  Anesthesia Type: General  Level of Consciousness: awake, sedated and patient cooperative  Airway & Oxygen Therapy: Patient Spontanous Breathing and Patient connected to face mask oxygen  Post-op Assessment: Report given to PACU RN and Post -op Vital signs reviewed and stable  Post vital signs: Reviewed and stable  Complications: No apparent anesthesia complications

## 2012-07-25 NOTE — ED Notes (Signed)
Patient transferred via [facility] from [facility]  For surgical evaluation for gallstones by Dr. Biagio Quint. Patient currently in no distress denies pain at this time.

## 2012-07-26 ENCOUNTER — Encounter (HOSPITAL_COMMUNITY): Payer: Self-pay | Admitting: General Surgery

## 2012-07-26 DIAGNOSIS — K810 Acute cholecystitis: Principal | ICD-10-CM

## 2012-07-26 HISTORY — DX: Acute cholecystitis: K81.0

## 2012-07-26 LAB — COMPREHENSIVE METABOLIC PANEL
BUN: 7 mg/dL (ref 6–23)
CO2: 23 mEq/L (ref 19–32)
Calcium: 8.4 mg/dL (ref 8.4–10.5)
Chloride: 102 mEq/L (ref 96–112)
Creatinine, Ser: 0.76 mg/dL (ref 0.50–1.10)
GFR calc Af Amer: >90 mL/min (ref >90)
GFR calc non Af Amer: >90 mL/min (ref >90)
Glucose, Bld: 165 mg/dL — ABNORMAL HIGH (ref 70–99)
Total Bilirubin: 0.2 mg/dL — ABNORMAL LOW (ref 0.3–1.2)

## 2012-07-26 LAB — CBC
Hemoglobin: 12.3 g/dL (ref 12.0–15.0)
MCH: 29 pg (ref 26.0–34.0)
Platelets: 269 10*3/uL (ref 150–400)
RBC: 4.24 MIL/uL (ref 3.87–5.11)
WBC: 10 10*3/uL (ref 4.0–10.5)

## 2012-07-26 LAB — GC/CHLAMYDIA PROBE AMP, GENITAL: GC Probe Amp, Genital: NEGATIVE

## 2012-07-26 MED ORDER — HYDROCODONE-ACETAMINOPHEN 5-325 MG PO TABS: 1.0000 | ORAL_TABLET | Freq: Four times a day (QID) | ORAL | Status: AC | PRN

## 2012-07-26 MED ORDER — IBUPROFEN 200 MG PO TABS: ORAL_TABLET | ORAL | Status: AC

## 2012-07-26 MED ORDER — ACETAMINOPHEN 325 MG PO TABS: 650.0000 mg | ORAL_TABLET | Freq: Four times a day (QID) | ORAL | Status: AC | PRN

## 2012-07-26 NOTE — Discharge Summary (Signed)
Recovering well Continue to follow

## 2012-07-26 NOTE — Discharge Summary (Signed)
Physician Discharge Summary  Patient ID: Linda Pena MRN: [id number] DOB/AGE: Apr 27, 1986 25 y.o.  Admit date: 07/25/2012 Discharge date: 07/26/2012  Admission Diagnoses: Cholecystitis   Discharge Diagnoses: Same Principal Problem:  *Acute cholecystitis   PROCEDURES: LAPAROSCOPIC CHOLECYSTECTOMY WITH INTRAOPERATIVE CHOLANGIOGRAM (N/A)  HERNIA REPAIR UMBILICAL ADULT (N/A) 07/25/2012, Dr.Brian [facility] Course: this patient was in her usual state of health until approximately 10:00 last night she began having back pain and periumbilical pain. She described this as a "bad ache" the severity of 9 and 10. She thought this would go away but it persisted and she presented to med center [facility] for evaluation. She had some nausea while at the emergency room as well as an episode of vomiting but she was not feeling nauseated previously. The ordered a CT scan of the abdomen which demonstrated cholelithiasis with some gallbladder wall thickening and some pericholecystic fluid and a followup ultrasound was obtained which confirmed the diagnosis. She says that she has not been having abdominal pain and has not noticed any change with bleeding. She says her bowels are normal And denies any blood in the stool or melena. She denies any fevers or chills or urinary symptoms. She was seen in ER and taken to OR by Dr. Biagio Quint.  She tolerated the procedure well.  We plan to mobilize her and if she tolerates PO's well home later today. No lifting over 20 pounds for 4 week Return:  2-3 weeks Dr. Biagio Quint  Condition on D/C:  Improved.   Disposition: 01-Home or Self Care   Medication List  As of 07/26/2012  9:21 AM   TAKE these medications         acetaminophen 325 MG tablet   Commonly known as: TYLENOL   Take 2 tablets (650 mg total) by mouth every 6 (six) hours as needed for pain.      HYDROcodone-acetaminophen 5-325 MG per tablet   Commonly known as: NORCO/VICODIN   Take 1-2 tablets by  mouth every 6 (six) hours as needed (pain).      ibuprofen 200 MG tablet   Commonly known as: ADVIL,MOTRIN   2-3 tablets every 6 hours as needed.      Vitamin D 400 UNITS capsule   Take 400 Units by mouth daily.           Follow-up Information    Follow up with LAYTON, BRIAN DAVID, DO. Schedule an appointment as soon as possible for a visit in 2 weeks. (Make an appointment in 2-3 weeks.  )    Contact information:   [address] [place] [address] [phone]       Please follow up. (No lifting over 20 pounds for 4 weeks.)          Signed: Sherrie George 07/26/2012, 9:21 AM

## 2012-07-26 NOTE — Progress Notes (Signed)
1 Day Post-Op  Subjective: Doing well this AM hasn't eaten much so far. No major complaints.  Objective: Vital signs in last 24 hours: Temp:  [97.5 F (36.4 C)-99 F (37.2 C)] 98.5 F (36.9 C) (08/26 0409) Pulse Rate:  [60-82] 67  (08/26 0409) Resp:  [14-21] 16  (08/26 0409) BP: (97-129)/(46-78) 114/50 mmHg (08/26 0409) SpO2:  [96 %-100 %] 100 % (08/26 0409) Weight:  [73.483 kg (162 lb)] 73.483 kg (162 lb) (08/25 1600) Last BM Date: 07/24/12  240 PO recorded, regular diet, afebrile, VSS, lab are all normal, IOC was normal.  Intake/Output from previous day: 08/25 0701 - 08/26 0700 In: 1990 [P.O.:240; I.V.:1750] Out: 2000 [Urine:2000] Intake/Output this shift: Total I/O In: -  Out: 600 [Urine:600]  General appearance: alert, cooperative and no distress GI: soft, post op tender, no flatus, +BS. wounds OK.  Lab Results:   Basename 07/26/12 0426 07/25/12 0513  WBC 10.0 13.7*  HGB 12.3 12.7  HCT 35.8* 37.5  PLT 269 301    BMET  Basename 07/26/12 0426 07/25/12 0513  NA 133* 137  K 4.0 4.0  CL 102 101  CO2 23 26  GLUCOSE 165* 105*  BUN 7 17  CREATININE 0.76 0.90  CALCIUM 8.4 9.2   PT/INR No results found for this basename: LABPROT:2,INR:2 in the last 72 hours   Lab 07/26/12 0426 07/25/12 0513  AST 32 20  ALT 28 18  ALKPHOS 65 68  BILITOT 0.2* 0.2*  PROT 6.6 7.8  ALBUMIN 3.3* 4.1     Lipase     Component Value Date/Time   LIPASE 42 07/25/2012 0513     Studies/Results: Dg Cholangiogram Operative  07/25/2012  *RADIOLOGY REPORT*  Clinical Data:   Cholecystectomy.  INTRAOPERATIVE CHOLANGIOGRAM  Technique:  Cholangiographic images from the C-arm fluoroscopic device were submitted for interpretation post-operatively.  Please see the procedural report for the amount of contrast and the fluoroscopy time utilized.  Comparison:  None.  Findings:  The common bile duct and visualized biliary and hepatic ducts are unremarkable.  There is some leakage of contrast  material.  No filling defect is identified.  Contrast flows into the duodenum.  IMPRESSION: Negative for retained stone or ductal dilatation.   Original Report Authenticated By: Bernadene Bell. Maricela Curet, M.D.    US Abdomen Complete  07/25/2012  *RADIOLOGY REPORT*  Clinical Data:  26 year old female with abdominal pain.  Abnormal- appearing gallbladder on recent CT.  ABDOMINAL ULTRASOUND COMPLETE  Comparison:  07/25/2012 CT  Findings:  Gallbladder:  A 1.7 cm gallstone at the gallbladder neck is noted. Gallbladder wall thickening is present without pericholecystic fluid.  An equivocal sonographic Murphy's sign is noted.  Common Bile Duct:  There is no evidence of intrahepatic or extrahepatic biliary dilation. The CBD measures 2.9 mm in greatest diameter.  Liver:  The liver is within normal limits in parenchymal echogenicity. No focal abnormalities are identified.  IVC:  Appears normal.  Pancreas:  Although the pancreas is difficult to visualize in its entirety, no focal pancreatic abnormality is identified.  Spleen:  Within normal limits in size and echotexture.  Right kidney:  The right kidney is normal in size and parenchymal echogenicity.  There is no evidence of solid mass, hydronephrosis or definite renal calculi.  The right kidney measures 9.5 cm.  Left kidney:  The left kidney is normal in size and parenchymal echogenicity.  There is no evidence of solid mass, hydronephrosis or definite renal calculi.   The left kidney measures 9.9  cm.  Abdominal Aorta:  No abdominal aortic aneurysm identified.  There is no evidence of ascites.  IMPRESSION: 1.7 cm gallstone at the gallbladder neck with gallbladder wall thickening - likely representing acute cholecystitis. No evidence of biliary dilatation.  No other abnormalities identified.   Original Report Authenticated By: Rosendo Gros, M.D.    Ct Abdomen Pelvis W Contrast  07/25/2012  *RADIOLOGY REPORT*  Clinical Data: Abdominal and back pain.  CT ABDOMEN AND PELVIS WITH  CONTRAST  Technique:  Multidetector CT imaging of the abdomen and pelvis was performed following the standard protocol during bolus administration of intravenous contrast.  Contrast: OMNIPAQUE IOHEXOL 300 MG/ML  SOLN  Comparison: None.  Findings: The lung bases are clear.  There is no evidence for free intraperitoneal air.  The gallbladder is moderately distended with fluid around the gallbladder.  There is a dense calcification near the gallbladder neck consistent with a stone. Normal appearance of liver and portal venous system.  Pancreas has a normal appearance without pancreatic duct dilatation.  Normal appearance of the adrenal tissue and kidneys.  Spleen has a normal appearance.  The patient has an IUD. No gross abnormality to the uterus or adnexal tissue.  No significant pelvic free fluid.  No acute bony abnormality.  IMPRESSION: The gallbladder is moderately distended with surrounding edema and a calcified stone near the gallbladder neck.  Findings are concerning for acute cholecystitis.   Original Report Authenticated By: Richarda Overlie, M.D.     Medications:    . levofloxacin (LEVAQUIN) IV  500 mg Intravenous Q24H  . lip balm      . DISCONTD: iohexol  20 mL Oral Q1 Hr x 2    Assessment/Plan Cholecystitis s/p LAPAROSCOPIC CHOLECYSTECTOMY WITH INTRAOPERATIVE CHOLANGIOGRAM (N/A)  HERNIA REPAIR UMBILICAL ADULT (N/A).  07/25/2012 Lodema Pilot, DO   Plan:  Mobilize, advance diet, home later today if she does well.       LOS: 1 day    Linda Pena 07/26/2012

## 2012-07-26 NOTE — Op Note (Signed)
NAMENURY, NEBERGALL NO.:  [id number]  MEDICAL RECORD NO.:  [id number]  LOCATION:  1534                         FACILITY:  [facility]  PHYSICIAN:  Lodema Pilot, MD       DATE OF BIRTH:  06-21-1986  DATE OF PROCEDURE:  07/25/2012 DATE OF DISCHARGE:                              OPERATIVE REPORT   PROCEDURE:  Laparoscopic cholecystectomy with intraoperative cholangiogram and repair of umbilical hernia.  PREOPERATIVE DIAGNOSES:  Acute cholecystitis and umbilical hernia.  POSTOPERATIVE DIAGNOSES:  Acute cholecystitis and umbilical hernia.  SURGEON:  Lodema Pilot, MD  ASSISTANT:  None.  ANESTHESIA:  General endotracheal tube anesthesia with 30 mL of 1% lidocaine with epinephrine and 0.25% Marcaine in a 50:50 mixture.  FLUIDS:  1600 mL of crystalloid.  ESTIMATED BLOOD LOSS:  Minimal.  DRAINS:  None.  SPECIMENS:  Gallbladder and contents sent to Pathology for permanent sectioning.  FINDINGS:  Edematous gallbladder, normal cholangiogram, large gallstones in the neck of the gallbladder, small umbilical hernia without evidence of incarceration.  COMPLICATIONS:  None apparent.  INDICATION FOR PROCEDURE:  Ms. Segar is a 26 year old female with a 1- day history of back and abdominal pain.  She had episode of nausea and vomiting as well.  She presented to [facility] and CT scan demonstrated thickened gallbladder with gallstone and edema around the gallbladder.  Ultrasound was obtained, which confirmed the diagnosis of thickened gallbladder wall and pericholecystic fluid.  She had a white count of 13.7 and sent over here for cholecystectomy and concern for cholecystitis.  She also had an umbilical hernia, which has caused symptoms in the past and was incarcerated previously, which required reduction in the ER.  OPERATIVE DETAILS:  Ms. Auerbach was seen and evaluated in the emergency room and risks and benefits of procedure were discussed in lay terms  and informed consent was obtained.  She was given therapeutic antibiotics and taken to the operating room, placed on the table in supine position, and general endotracheal tube anesthesia was obtained.  A Foley catheter was placed and her abdomen was prepped and draped in a standard surgical fashion.  A semicircular infraumbilical incision was made in the skin and dissection carried down to the subcutaneous tissue using blunt dissection.  The umbilical stalk was elevated and the fascia was sharply incised and the peritoneum entered bluntly.  The 0 Vicryl sutures were placed through the fascia and 12-mm balloon port was placed at the umbilicus.  Pneumoperitoneum was obtained and laparoscope was introduced and there was no evidence of bowel injury upon entry.  An 11-mm epigastric trocar was placed and two 5-mm right upper quadrant trocars were placed under direct visualization.  The gallbladder was retracted cephalad and was noted to be edematous in the area of the triangle of Calot in the portal region, but no other obvious source was identified. She did have a gallstone in the neck of the gallbladder, but I was able to milk this back into the gallbladder for adequate retraction.  The peritoneal attachments were taken down using blunt dissection.  The cystic duct was identified and skeletonized and the cystic artery was also identified.  The critical view of safety was  obtained visualizing a single cystic duct entering the gallbladder and liver parenchyma through the triangle of Calot with a single cystic artery coursing onto the gallbladder.  Clip was placed on the artery and clip was placed on the gallbladder side of the cystic duct.  Cholangiogram catheter was passed through the abdominal wall through a separate stab incision and small cystic ductotomy was made and cholangiogram was performed, which demonstrated a long cystic duct and normal flow of bile into the duodenum without any  evidence of common bile defect.  Right and left hepatic ducts were identified.  Then, catheter was removed and three clips were placed on the cystic duct stump and the duct was transected and the previously clipped artery was also skeletonized and then divided between hemoclips, and then gallbladder was removed from the gallbladder fossa using Bovie electrocautery and blunt dissection.  The gallbladder was not entered during the dissection.  The gallbladder was completely removed from the gallbladder fossa.  The gallbladder was placed in an Endocatch bag and removed from the umbilical trocar site.  She did have a palpable gallstones and this was sent to Pathology for permanent sectioning with the gallbladder.  The umbilical trocar was replaced in the abdomen and the gallbladder fossa was inspected for hemostasis, which was noted to be adequate.  There was no evidence of bleeding or bowel injury or bile leakage.  The right upper quadrant was irrigated until the irrigation returned clear, and the 5-mm trocars were removed under direct visualization.  The abdominal wall was noted to be hemostatic and I connected my 12-mm port site to her umbilical defect and then approximated the defect as one entire defect from the cephalad aspect of the fascia to the caudad.  Aspect of the fascial edges appeared healthy and several interrupted 0 Vicryl sutures were used for the closure.  The sutures were secured and the abdomen was re- insufflated with carbon dioxide gas and the umbilical closure was noted to be adequate without any evidence of bowel injury.  The right upper quadrant appeared hemostatic without evidence of bowel injury or bleeding or bile.  The final trocar was removed and the incisions were injected with 30 mL of 1% lidocaine with epinephrine and 0.25% Marcaine in a 50:50 mixture.  Skin edges were approximated with 4-0 Monocryl interrupted subcuticular suture.  Skin edges were washed and  dried. Dermabond was applied.  All sponge, needle, and instrument counts were correct at the end of the case.  The patient tolerated the procedure well without apparent complication.  Foley catheter was removed and she was stable and ready for transfer to the recovery room.          ______________________________ Lodema Pilot, MD     BL/MEDQ  D:  07/25/2012  T:  07/26/2012  Job:  [id number]

## 2012-07-26 NOTE — Progress Notes (Signed)
Improving  OK to d/c

## 2012-07-26 NOTE — Care Management Note (Signed)
    Page 1 of 1   07/26/2012     11:28:59 AM   CARE MANAGEMENT NOTE 07/26/2012  Patient:  Linda Pena, Linda Pena   Account Number:  [id number]  Date Initiated:  07/26/2012  Documentation initiated by:  Lorenda Ishihara  Subjective/Objective Assessment:   26 yo female admitted s/p lap chole and hernia repair. PTA lived at home with family.     Action/Plan:   Anticipated DC Date:  07/26/2012   Anticipated DC Plan:  HOME/SELF CARE      DC Planning Services  CM consult      Choice offered to / List presented to:             Status of service:  Completed, signed off Medicare Important Message given?   (If response is "NO", the following Medicare IM given date fields will be blank) Date Medicare IM given:   Date Additional Medicare IM given:    Discharge Disposition:  HOME/SELF CARE  Per UR Regulation:  Reviewed for med. necessity/level of care/duration of stay  If discussed at Long Length of Stay Meetings, dates discussed:    Comments:

## 2012-07-26 NOTE — Anesthesia Postprocedure Evaluation (Signed)
  Anesthesia Post-op Note  Patient: Linda Pena  Procedure(s) Performed: Procedure(s) (LRB): LAPAROSCOPIC CHOLECYSTECTOMY WITH INTRAOPERATIVE CHOLANGIOGRAM (N/A) HERNIA REPAIR UMBILICAL ADULT (N/A)  Patient Location: PACU  Anesthesia Type: General  Level of Consciousness: awake and alert   Airway and Oxygen Therapy: Patient Spontanous Breathing  Post-op Pain: mild  Post-op Assessment: Post-op Vital signs reviewed, Patient's Cardiovascular Status Stable, Respiratory Function Stable, Patent Airway and No signs of Nausea or vomiting  Post-op Vital Signs: stable  Complications: No apparent anesthesia complications

## 2012-08-03 ENCOUNTER — Telehealth (INDEPENDENT_AMBULATORY_CARE_PROVIDER_SITE_OTHER): Payer: Self-pay

## 2012-08-03 NOTE — Telephone Encounter (Signed)
Pt called stating the rtw note given by PA at d/c did not state can rtw without restriction. Reviewed with Dr Biagio Quint. Per his request pt rtw note filled out with not restrictions and can rtw today. Per pt note faxed to [facility] [phone].

## 2013-04-22 ENCOUNTER — Encounter (HOSPITAL_BASED_OUTPATIENT_CLINIC_OR_DEPARTMENT_OTHER): Payer: Self-pay | Admitting: Emergency Medicine

## 2013-04-22 ENCOUNTER — Emergency Department (HOSPITAL_BASED_OUTPATIENT_CLINIC_OR_DEPARTMENT_OTHER)
Admission: EM | Admit: 2013-04-22 | Discharge: 2013-04-22 | Disposition: A | Discharge status: Home / Self Care | Payer: BC Managed Care – PPO | Attending: Emergency Medicine | Admitting: Emergency Medicine

## 2013-04-22 DIAGNOSIS — K2970 Gastritis, unspecified, without bleeding: Secondary | ICD-10-CM

## 2013-04-22 DIAGNOSIS — K2990 Gastroduodenitis, unspecified, without bleeding: Secondary | ICD-10-CM | POA: Insufficient documentation

## 2013-04-22 DIAGNOSIS — Z8719 Personal history of other diseases of the digestive system: Secondary | ICD-10-CM | POA: Insufficient documentation

## 2013-04-22 DIAGNOSIS — Z79899 Other long term (current) drug therapy: Secondary | ICD-10-CM | POA: Insufficient documentation

## 2013-04-22 DIAGNOSIS — Z9089 Acquired absence of other organs: Secondary | ICD-10-CM | POA: Insufficient documentation

## 2013-04-22 MED ORDER — GI COCKTAIL ~~LOC~~
30.0000 mL | Freq: Once | ORAL | Status: AC
  Administered 2013-04-22: 30 mL via ORAL
  Filled 2013-04-22: qty 30

## 2013-04-22 MED ORDER — PANTOPRAZOLE SODIUM 40 MG PO TBEC: 40.0000 mg | DELAYED_RELEASE_TABLET | Freq: Every day | ORAL | Status: DC

## 2013-04-22 NOTE — Discharge Instructions (Signed)
Gastritis, Adult  Gastritis is soreness and swelling (inflammation) of the lining of the stomach. Gastritis can develop as a sudden onset (acute) or long-term (chronic) condition. If gastritis is not treated, it can lead to stomach bleeding and ulcers.  CAUSES   Gastritis occurs when the stomach lining is weak or damaged. Digestive juices from the stomach then inflame the weakened stomach lining. The stomach lining may be weak or damaged due to viral or bacterial infections. One common bacterial infection is the Helicobacter pylori infection. Gastritis can also result from excessive alcohol consumption, taking certain medicines, or having too much acid in the stomach.   SYMPTOMS   In some cases, there are no symptoms. When symptoms are present, they may include:  · Pain or a burning sensation in the upper abdomen.  · Nausea.  · Vomiting.  · An uncomfortable feeling of fullness after eating.  DIAGNOSIS   Your caregiver may suspect you have gastritis based on your symptoms and a physical exam. To determine the cause of your gastritis, your caregiver may perform the following:  · Blood or stool tests to check for the H pylori bacterium.  · Gastroscopy. A thin, flexible tube (endoscope) is passed down the esophagus and into the stomach. The endoscope has a light and camera on the end. Your caregiver uses the endoscope to view the inside of the stomach.  · Taking a tissue sample (biopsy) from the stomach to examine under a microscope.  TREATMENT   Depending on the cause of your gastritis, medicines may be prescribed. If you have a bacterial infection, such as an H pylori infection, antibiotics may be given. If your gastritis is caused by too much acid in the stomach, H2 blockers or antacids may be given. Your caregiver may recommend that you stop taking aspirin, ibuprofen, or other nonsteroidal anti-inflammatory drugs (NSAIDs).  HOME CARE INSTRUCTIONS  · Only take over-the-counter or prescription medicines as directed by  your caregiver.  · If you were given antibiotic medicines, take them as directed. Finish them even if you start to feel better.  · Drink enough fluids to keep your urine clear or pale yellow.  · Avoid foods and drinks that make your symptoms worse, such as:  · Caffeine or alcoholic drinks.  · Chocolate.  · Peppermint or mint flavorings.  · Garlic and onions.  · Spicy foods.  · Citrus fruits, such as oranges, lemons, or limes.  · Tomato-based foods such as sauce, chili, salsa, and pizza.  · Fried and fatty foods.  · Eat small, frequent meals instead of large meals.  SEEK IMMEDIATE MEDICAL CARE IF:   · You have black or dark red stools.  · You vomit blood or material that looks like coffee grounds.  · You are unable to keep fluids down.  · Your abdominal pain gets worse.  · You have a fever.  · You do not feel better after 1 week.  · You have any other questions or concerns.  MAKE SURE YOU:  · Understand these instructions.  · Will watch your condition.  · Will get help right away if you are not doing well or get worse.  Document Released: 11/11/2001 Document Revised: 05/18/2012 Document Reviewed: 12/31/2011  [employer]® Patient Information ©2014 [employer].

## 2013-04-22 NOTE — ED Notes (Signed)
Pt with epigastric pressure/pain feeling that is worse with a deep breath and comes and goes since yesterday, no pain currently. No recent illness, no urinary or bowel symptoms other than this intermittent pressure/pain in upper abdomen.

## 2013-04-22 NOTE — ED Provider Notes (Signed)
History     CSN: [id number]  Arrival date & time 04/22/13  [id number]   First MD Initiated Contact with Patient 04/22/13 [phone]      Chief Complaint  Patient presents with  . Abdominal Pain    (Consider location/radiation/quality/duration/timing/severity/associated sxs/prior treatment) HPI Comments: Pt takes BC powder almost daily due to frequent HA's.  PT has had choecystectomy in the past, pain is not as severe as that pain.  No N/V/D . Not worse with eating.  Not SOB, but pain seems alittle worse with deep breath.  No CP, cough.  deneis any lwoer abd pain, vaginal symptoms, no dysuria or urinary freq.    Patient is a 27 y.o. female presenting with abdominal pain. The history is provided by the patient.  Abdominal Pain The current episode started yesterday. The problem occurs constantly. The problem has not changed since onset.Associated symptoms include abdominal pain. Pertinent negatives include no shortness of breath. Exacerbated by: Deep breath. Nothing relieves the symptoms. Treatments tried: pepcid. The treatment provided no relief.    Past Medical History  Diagnosis Date  . Acute cholecystitis 07/26/2012    Past Surgical History  Procedure Laterality Date  . Diagnostic laparoscopy  2007  . Cholecystectomy  07/25/2012    Procedure: LAPAROSCOPIC CHOLECYSTECTOMY WITH INTRAOPERATIVE CHOLANGIOGRAM;  Surgeon: Lodema Pilot, DO;  Location: [facility];  Service: General;  Laterality: N/A;  . Umbilical hernia repair  07/25/2012    Procedure: HERNIA REPAIR UMBILICAL ADULT;  Surgeon: Lodema Pilot, DO;  Location: [facility];  Service: General;  Laterality: N/A;    History reviewed. No pertinent family history.  History  Substance Use Topics  . Smoking status: Never Smoker   . Smokeless tobacco: Not on file  . Alcohol Use: Yes    OB History   Grav Para Term Preterm Abortions TAB SAB Ect Mult Living                  Review of Systems  Constitutional: Negative for fever, chills and appetite  change.  Respiratory: Negative for shortness of breath.   Gastrointestinal: Positive for abdominal pain. Negative for nausea, vomiting and diarrhea.  Musculoskeletal: Negative for back pain.  All other systems reviewed and are negative.    Allergies  Review of patient's allergies indicates no known allergies.  Home Medications   Current Outpatient Rx  Name  Route  Sig  Dispense  Refill  . acetaminophen (TYLENOL) 325 MG tablet   Oral   Take 2 tablets (650 mg total) by mouth every 6 (six) hours as needed for pain.         . Cholecalciferol (VITAMIN D) 400 UNITS capsule   Oral   Take 400 Units by mouth daily.         Marland Kitchen ibuprofen (ADVIL) 200 MG tablet      2-3 tablets every 6 hours as needed.   30 tablet   0   . pantoprazole (PROTONIX) 40 MG tablet   Oral   Take 1 tablet (40 mg total) by mouth daily.   14 tablet   0     BP 137/64  Pulse 90  Temp(Src) 98.9 F (37.2 C) (Oral)  Resp 16  Ht 5' (1.524 m)  Wt 170 lb (77.111 kg)  BMI 33.2 kg/m2  SpO2 96%  LMP 04/14/2013  Physical Exam  Constitutional: She is oriented to person, place, and time. She appears well-developed and well-nourished. No distress.  HENT:  Head: Normocephalic and atraumatic.  Eyes: Conjunctivae and EOM  are normal. No scleral icterus.  Neck: Normal range of motion. Neck supple.  Cardiovascular: Normal rate and intact distal pulses.   No murmur heard. Pulmonary/Chest: Effort normal.  Abdominal: Soft. She exhibits no distension. There is no tenderness. There is no rebound and no guarding.  Neurological: She is alert and oriented to person, place, and time.  Skin: Skin is warm and dry.  Psychiatric: She has a normal mood and affect.    ED Course  Procedures (including critical care time)  Labs Reviewed - No data to display No results found.   1. Gastritis     Room air saturation is 96% and I interpret to be normal   9:39 AM Improvement after GI cocktail, suspect gastritis.     MDM  Patient has normal vital signs, no significant tenderness on exam. There is no associated vomiting, diarrhea. Have no suspicion for surgical abdomen. This is highly unlikely to be GYN related given the location of her pain in her extreme upper epigastric region. She has no risk factors for PE and denies any upper back pain or chest pain. I suspect given that she takes [facility] powders daily, history of gallbladder disease, this is likely more of a upper GI or gastric discomfort. Plan is to give a GI cocktail and to give her a week's worth of Nexium or Protonix.         Gavin Pound. Oletta Lamas, MD 04/22/13 [phone]

## 2015-04-09 ENCOUNTER — Emergency Department (HOSPITAL_BASED_OUTPATIENT_CLINIC_OR_DEPARTMENT_OTHER)
Admission: EM | Admit: 2015-04-09 | Discharge: 2015-04-09 | Disposition: A | Discharge status: Home / Self Care | Payer: 59 | Attending: Emergency Medicine | Admitting: Emergency Medicine

## 2015-04-09 ENCOUNTER — Emergency Department (HOSPITAL_BASED_OUTPATIENT_CLINIC_OR_DEPARTMENT_OTHER): Payer: 59

## 2015-04-09 ENCOUNTER — Encounter (HOSPITAL_BASED_OUTPATIENT_CLINIC_OR_DEPARTMENT_OTHER): Payer: Self-pay | Admitting: *Deleted

## 2015-04-09 ENCOUNTER — Other Ambulatory Visit: Payer: Self-pay

## 2015-04-09 DIAGNOSIS — K219 Gastro-esophageal reflux disease without esophagitis: Secondary | ICD-10-CM | POA: Insufficient documentation

## 2015-04-09 DIAGNOSIS — Z79899 Other long term (current) drug therapy: Secondary | ICD-10-CM | POA: Diagnosis not present

## 2015-04-09 DIAGNOSIS — Z3201 Encounter for pregnancy test, result positive: Secondary | ICD-10-CM | POA: Insufficient documentation

## 2015-04-09 DIAGNOSIS — Z331 Pregnant state, incidental: Secondary | ICD-10-CM | POA: Diagnosis not present

## 2015-04-09 DIAGNOSIS — R079 Chest pain, unspecified: Secondary | ICD-10-CM | POA: Diagnosis present

## 2015-04-09 DIAGNOSIS — Z3490 Encounter for supervision of normal pregnancy, unspecified, unspecified trimester: Secondary | ICD-10-CM

## 2015-04-09 LAB — PREGNANCY, URINE: Preg Test, Ur: POSITIVE — AB

## 2015-04-09 MED ORDER — SUCRALFATE 1 G PO TABS: 1.0000 g | ORAL_TABLET | Freq: Three times a day (TID) | ORAL | Status: DC

## 2015-04-09 MED ORDER — PANTOPRAZOLE SODIUM 40 MG PO TBEC: 40.0000 mg | DELAYED_RELEASE_TABLET | Freq: Every day | ORAL | Status: DC

## 2015-04-09 MED ORDER — OMEPRAZOLE 40 MG PO CPDR: 40.0000 mg | DELAYED_RELEASE_CAPSULE | Freq: Every day | ORAL | Status: DC

## 2015-04-09 MED ORDER — GI COCKTAIL ~~LOC~~
30.0000 mL | Freq: Once | ORAL | Status: AC
  Administered 2015-04-09: 30 mL via ORAL
  Filled 2015-04-09: qty 30

## 2015-04-09 MED ORDER — PANTOPRAZOLE SODIUM 40 MG PO TBEC
40.0000 mg | DELAYED_RELEASE_TABLET | Freq: Once | ORAL | Status: AC
  Administered 2015-04-09: 40 mg via ORAL
  Filled 2015-04-09: qty 1

## 2015-04-09 MED ORDER — PRENATAL 27-0.8 MG PO TABS: 1.0000 | ORAL_TABLET | Freq: Every day | ORAL | Status: DC

## 2015-04-09 NOTE — ED Provider Notes (Addendum)
TIME SEEN: 1:00 PM  CHIEF COMPLAINT: Chest pain  HPI: Pt is a 29 y.o. female with history of cholecystectomy and umbilical hernia repair who presents to the emergency department with complaints of burning chest pain after eating [employer] yesterday morning. She tried an over-the-counter PPI without any relief. Has also tried Tum's with some relief but then symptoms quickly return after eating again. She denies any shortness of breath, cough. No fever. No abdominal pain. No nausea, vomiting or diarrhea. No bright red blood per rectum or melena. Last menstrual period was April 13. She states she has never had heartburn before. No history of NSAID use. Does drink alcohol approximately 4 days a week, 2-3 glasses of wine a night.  Denies history of hypertension, diabetes, hyperlipidemia, tobacco use or family history of premature CAD. No history of PE or DVT, recent prolonged immobilization such as long flight or hospitalization, fracture, surgery, trauma, exogenous estrogen use. No calf tenderness or swelling.  ROS: See HPI Constitutional: no fever  Eyes: no drainage  ENT: no runny nose   Cardiovascular:  no chest pain  Resp: no SOB  GI: no vomiting GU: no dysuria Integumentary: no rash  Allergy: no hives  Musculoskeletal: no leg swelling  Neurological: no slurred speech ROS otherwise negative  PAST MEDICAL HISTORY/PAST SURGICAL HISTORY:  Past Medical History  Diagnosis Date  . Acute cholecystitis 07/26/2012    MEDICATIONS:  Prior to Admission medications   Medication Sig Start Date End Date Taking? Authorizing Provider  ibuprofen (ADVIL) 200 MG tablet 2-3 tablets every 6 hours as needed. 07/26/12  Yes Sherrie GeorgeWillard Jennings, PA-C  Cholecalciferol (VITAMIN D) 400 UNITS capsule Take 400 Units by mouth daily.    Historical Provider, MD  pantoprazole (PROTONIX) 40 MG tablet Take 1 tablet (40 mg total) by mouth daily. 04/22/13   Quita SkyeMichael Ghim, MD    ALLERGIES:  No Known Allergies  SOCIAL  HISTORY:  History  Substance Use Topics  . Smoking status: Never Smoker   . Smokeless tobacco: Not on file  . Alcohol Use: Yes    FAMILY HISTORY: No family history on file.  EXAM: BP 112/88 mmHg  Pulse 93  Temp(Src) 98.8 F (37.1 C) (Oral)  Resp 18  Ht 5' (1.524 m)  Wt 170 lb (77.111 kg)  BMI 33.20 kg/m2  SpO2 100%  LMP 03/14/2015 (Exact Date) CONSTITUTIONAL: Alert and oriented and responds appropriately to questions. Well-appearing; well-nourished HEAD: Normocephalic EYES: Conjunctivae clear, PERRL ENT: normal nose; no rhinorrhea; moist mucous membranes; pharynx without lesions noted NECK: Supple, no meningismus, no LAD  CARD: RRR; S1 and S2 appreciated; no murmurs, no clicks, no rubs, no gallops RESP: Normal chest excursion without splinting or tachypnea; breath sounds clear and equal bilaterally; no wheezes, no rhonchi, no rales, no hypoxia or respiratory distress, speaking full sentences ABD/GI: Normal bowel sounds; non-distended; soft, non-tender, no rebound, no guarding, no peritoneal signs BACK:  The back appears normal and is non-tender to palpation, there is no CVA tenderness EXT: Normal ROM in all joints; non-tender to palpation; no edema; normal capillary refill; no cyanosis, no calf tenderness or swelling    SKIN: Normal color for age and race; warm NEURO: Moves all extremities equally, sensation to light touch intact diffusely, cranial nerves II through XII intact PSYCH: The patient's mood and manner are appropriate. Grooming and personal hygiene are appropriate.  MEDICAL DECISION MAKING: Patient here with atypical chest pain. EKG shows no ischemic changes, interval changes but she does have sinus arrhythmia. Suspect GERD. We'll  give GI cocktail and Protonix. Abdominal exam benign. Hemodynamically stable. She is PERC negative and HEART score of 0.  ED PROGRESS: 2:50 PM  Pt's chest x-ray is unremarkable. She reports feeling much better after GI cocktail and oral  Protonix.  UPT pending.   3:32 PM  Pt's urine pregnancy test is positive. Reports her last menstrual cycle was April 13. With this pregnancy she is a G6 P3 A2. Her OB/GYN is Dr. Achilles [facility].  We'll discharge her with Protonix, Carafate to use for her heartburn. Both are category B. Have advised her to follow-up with her OB/GYN. She denies any lower abdominal pain, dysuria, hematuria, vaginal bleeding or discharge. Discussed return precautions. She verbalizes understanding and is comfortable with plan.    EKG Interpretation  Date/Time:  Monday Apr 09 2015 12:46:12 EDT Ventricular Rate:  84 PR Interval:  146 QRS Duration: 80 QT Interval:  364 QTC Calculation: 430 R Axis:   68 Text Interpretation:  Normal sinus rhythm with sinus arrhythmia Normal ECG Confirmed by Jasmin Trumbull,  DO, Anson Peddie ([id number]) on 04/09/2015 1:08:17 PM        Layla MawKristen N Ibraheem Voris, DO 04/09/15 1533  Taleigha Pinson N Evie Croston, DO 04/09/15 1534

## 2015-04-09 NOTE — Discharge Instructions (Signed)
I recommend you avoid NSAIDs including ibuprofen, aspirin, Aleve.  You may take Tylenol for pain which is safe in pregnancy. I also recommended you avoid alcohol and greasy, spicy and acidic foods.  First Trimester of Pregnancy The first trimester of pregnancy is from week 1 until the end of week 12 (months 1 through 3). A week after a sperm fertilizes an egg, the egg will implant on the wall of the uterus. This embryo will begin to develop into a baby. Genes from you and your partner are forming the baby. The female genes determine whether the baby is a boy or a girl. At 6-8 weeks, the eyes and face are formed, and the heartbeat can be seen on ultrasound. At the end of 12 weeks, all the baby's organs are formed.  Now that you are pregnant, you will want to do everything you can to have a healthy baby. Two of the most important things are to get good prenatal care and to follow your health care provider's instructions. Prenatal care is all the medical care you receive before the baby's birth. This care will help prevent, find, and treat any problems during the pregnancy and childbirth. BODY CHANGES Your body goes through many changes during pregnancy. The changes vary from woman to woman.   You may gain or lose a couple of pounds at first.  You may feel sick to your stomach (nauseous) and throw up (vomit). If the vomiting is uncontrollable, call your health care provider.  You may tire easily.  You may develop headaches that can be relieved by medicines approved by your health care provider.  You may urinate more often. Painful urination may mean you have a bladder infection.  You may develop heartburn as a result of your pregnancy.  You may develop constipation because certain hormones are causing the muscles that push waste through your intestines to slow down.  You may develop hemorrhoids or swollen, bulging veins (varicose veins).  Your breasts may begin to grow larger and become tender.  Your nipples may stick out more, and the tissue that surrounds them (areola) may become darker.  Your gums may bleed and may be sensitive to brushing and flossing.  Dark spots or blotches (chloasma, mask of pregnancy) may develop on your face. This will likely fade after the baby is born.  Your menstrual periods will stop.  You may have a loss of appetite.  You may develop cravings for certain kinds of food.  You may have changes in your emotions from day to day, such as being excited to be pregnant or being concerned that something may go wrong with the pregnancy and baby.  You may have more vivid and strange dreams.  You may have changes in your hair. These can include thickening of your hair, rapid growth, and changes in texture. Some women also have hair loss during or after pregnancy, or hair that feels dry or thin. Your hair will most likely return to normal after your baby is born. WHAT TO EXPECT AT YOUR PRENATAL VISITS During a routine prenatal visit:  You will be weighed to make sure you and the baby are growing normally.  Your blood pressure will be taken.  Your abdomen will be measured to track your baby's growth.  The fetal heartbeat will be listened to starting around week 10 or 12 of your pregnancy.  Test results from any previous visits will be discussed. Your health care provider may ask you:  How you are feeling.  If you are feeling the baby move.  If you have had any abnormal symptoms, such as leaking fluid, bleeding, severe headaches, or abdominal cramping.  If you have any questions. Other tests that may be performed during your first trimester include:  Blood tests to find your blood type and to check for the presence of any previous infections. They will also be used to check for low iron levels (anemia) and Rh antibodies. Later in the pregnancy, blood tests for diabetes will be done along with other tests if problems develop.  Urine tests to check for  infections, diabetes, or protein in the urine.  An ultrasound to confirm the proper growth and development of the baby.  An amniocentesis to check for possible genetic problems.  Fetal screens for spina bifida and Down syndrome.  You may need other tests to make sure you and the baby are doing well. HOME CARE INSTRUCTIONS  Medicines  Follow your health care provider's instructions regarding medicine use. Specific medicines may be either safe or unsafe to take during pregnancy.  Take your prenatal vitamins as directed.  If you develop constipation, try taking a stool softener if your health care provider approves. Diet  Eat regular, well-balanced meals. Choose a variety of foods, such as meat or vegetable-based protein, fish, milk and low-fat dairy products, vegetables, fruits, and whole grain breads and cereals. Your health care provider will help you determine the amount of weight gain that is right for you.  Avoid raw meat and uncooked cheese. These carry germs that can cause birth defects in the baby.  Eating four or five small meals rather than three large meals a day may help relieve nausea and vomiting. If you start to feel nauseous, eating a few soda crackers can be helpful. Drinking liquids between meals instead of during meals also seems to help nausea and vomiting.  If you develop constipation, eat more high-fiber foods, such as fresh vegetables or fruit and whole grains. Drink enough fluids to keep your urine clear or pale yellow. Activity and Exercise  Exercise only as directed by your health care provider. Exercising will help you:  Control your weight.  Stay in shape.  Be prepared for labor and delivery.  Experiencing pain or cramping in the lower abdomen or low back is a good sign that you should stop exercising. Check with your health care provider before continuing normal exercises.  Try to avoid standing for long periods of time. Move your legs often if you  must stand in one place for a long time.  Avoid heavy lifting.  Wear low-heeled shoes, and practice good posture.  You may continue to have sex unless your health care provider directs you otherwise. Relief of Pain or Discomfort  Wear a good support bra for breast tenderness.   Take warm sitz baths to soothe any pain or discomfort caused by hemorrhoids. Use hemorrhoid cream if your health care provider approves.   Rest with your legs elevated if you have leg cramps or low back pain.  If you develop varicose veins in your legs, wear support hose. Elevate your feet for 15 minutes, 3-4 times a day. Limit salt in your diet. Prenatal Care  Schedule your prenatal visits by the twelfth week of pregnancy. They are usually scheduled monthly at first, then more often in the last 2 months before delivery.  Write down your questions. Take them to your prenatal visits.  Keep all your prenatal visits as directed by your health care provider.  Safety  Wear your seat belt at all times when driving.  Make a list of emergency phone numbers, including numbers for family, friends, the hospital, and police and fire departments. General Tips  Ask your health care provider for a referral to a local prenatal education class. Begin classes no later than at the beginning of month 6 of your pregnancy.  Ask for help if you have counseling or nutritional needs during pregnancy. Your health care provider can offer advice or refer you to specialists for help with various needs.  Do not use hot tubs, steam rooms, or saunas.  Do not douche or use tampons or scented sanitary pads.  Do not cross your legs for long periods of time.  Avoid cat litter boxes and soil used by cats. These carry germs that can cause birth defects in the baby and possibly loss of the fetus by miscarriage or stillbirth.  Avoid all smoking, herbs, alcohol, and medicines not prescribed by your health care provider. Chemicals in these  affect the formation and growth of the baby.  Schedule a dentist appointment. At home, brush your teeth with a soft toothbrush and be gentle when you floss. SEEK MEDICAL CARE IF:   You have dizziness.  You have mild pelvic cramps, pelvic pressure, or nagging pain in the abdominal area.  You have persistent nausea, vomiting, or diarrhea.  You have a bad smelling vaginal discharge.  You have pain with urination.  You notice increased swelling in your face, hands, legs, or ankles. SEEK IMMEDIATE MEDICAL CARE IF:   You have a fever.  You are leaking fluid from your vagina.  You have spotting or bleeding from your vagina.  You have severe abdominal cramping or pain.  You have rapid weight gain or loss.  You vomit blood or material that looks like coffee grounds.  You are exposed to [place] and have never had them.  You are exposed to fifth disease or chickenpox.  You develop a severe headache.  You have shortness of breath.  You have any kind of trauma, such as from a fall or a car accident. Document Released: 11/11/2001 Document Revised: 04/03/2014 Document Reviewed: 09/27/2013 [employer] [facility] [employer] Patient Information 2015 [place], [place]. This information is not intended to replace advice given to you by your health care provider. Make sure you discuss any questions you have with your health care provider.   Gastroesophageal Reflux Disease, Adult Gastroesophageal reflux disease (GERD) happens when acid from your stomach flows up into the esophagus. When acid comes in contact with the esophagus, the acid causes soreness (inflammation) in the esophagus. Over time, GERD may create small holes (ulcers) in the lining of the esophagus. CAUSES   Increased body weight. This puts pressure on the stomach, making acid rise from the stomach into the esophagus.  Smoking. This increases acid production in the stomach.  Drinking alcohol. This causes decreased pressure in the lower  esophageal sphincter (valve or ring of muscle between the esophagus and stomach), allowing acid from the stomach into the esophagus.  Late evening meals and a full stomach. This increases pressure and acid production in the stomach.  A malformed lower esophageal sphincter. Sometimes, no cause is found. SYMPTOMS   Burning pain in the lower part of the mid-chest behind the breastbone and in the mid-stomach area. This may occur twice a week or more often.  Trouble swallowing.  Sore throat.  Dry cough.  Asthma-like symptoms including chest tightness, shortness of breath, or wheezing. DIAGNOSIS  Your  caregiver may be able to diagnose GERD based on your symptoms. In some cases, X-rays and other tests may be done to check for complications or to check the condition of your stomach and esophagus. TREATMENT  Your caregiver may recommend over-the-counter or prescription medicines to help decrease acid production. Ask your caregiver before starting or adding any new medicines.  HOME CARE INSTRUCTIONS   Change the factors that you can control. Ask your caregiver for guidance concerning weight loss, quitting smoking, and alcohol consumption.  Avoid foods and drinks that make your symptoms worse, such as:  Caffeine or alcoholic drinks.  Chocolate.  Peppermint or mint flavorings.  Garlic and onions.  Spicy foods.  Citrus fruits, such as oranges, lemons, or limes.  Tomato-based foods such as sauce, chili, salsa, and pizza.  Fried and fatty foods.  Avoid lying down for the 3 hours prior to your bedtime or prior to taking a nap.  Eat small, frequent meals instead of large meals.  Wear loose-fitting clothing. Do not wear anything tight around your waist that causes pressure on your stomach.  Raise the head of your bed 6 to 8 inches with wood blocks to help you sleep. Extra pillows will not help.  Only take over-the-counter or prescription medicines for pain, discomfort, or fever as  directed by your caregiver.  Do not take aspirin, ibuprofen, or other nonsteroidal anti-inflammatory drugs (NSAIDs). SEEK IMMEDIATE MEDICAL CARE IF:   You have pain in your arms, neck, jaw, teeth, or back.  Your pain increases or changes in intensity or duration.  You develop nausea, vomiting, or sweating (diaphoresis).  You develop shortness of breath, or you faint.  Your vomit is green, yellow, black, or looks like coffee grounds or blood.  Your stool is red, bloody, or black. These symptoms could be signs of other problems, such as heart disease, gastric bleeding, or esophageal bleeding. MAKE SURE YOU:   Understand these instructions.  Will watch your condition.  Will get help right away if you are not doing well or get worse. Document Released: 08/27/2005 Document Revised: 02/09/2012 Document Reviewed: 06/06/2011 [facility] Patient Information 2015 [place], [place]. This information is not intended to replace advice given to you by your health care provider. Make sure you discuss any questions you have with your health care provider.  Food Choices for Gastroesophageal Reflux Disease When you have gastroesophageal reflux disease (GERD), the foods you eat and your eating habits are very important. Choosing the right foods can help ease the discomfort of GERD. WHAT GENERAL GUIDELINES DO I NEED TO FOLLOW?  Choose fruits, vegetables, whole grains, low-fat dairy products, and low-fat meat, fish, and poultry.  Limit fats such as oils, salad dressings, butter, nuts, and avocado.  Keep a food diary to identify foods that cause symptoms.  Avoid foods that cause reflux. These may be different for different people.  Eat frequent small meals instead of three large meals each day.  Eat your meals slowly, in a relaxed setting.  Limit fried foods.  Cook foods using methods other than frying.  Avoid drinking alcohol.  Avoid drinking large amounts of liquids with your meals.  Avoid  bending over or lying down until 2-3 hours after eating. WHAT FOODS ARE NOT RECOMMENDED? The following are some foods and drinks that may worsen your symptoms: Vegetables Tomatoes. Tomato juice. Tomato and spaghetti sauce. Chili peppers. Onion and garlic. Horseradish. Fruits Oranges, grapefruit, and lemon (fruit and juice). Meats High-fat meats, fish, and poultry. This includes hot dogs, ribs,  ham, sausage, salami, and bacon. Dairy Whole milk and chocolate milk. Sour cream. Cream. Butter. Ice cream. Cream cheese.  Beverages Coffee and tea, with or without caffeine. Carbonated beverages or energy drinks. Condiments Hot sauce. Barbecue sauce.  Sweets/Desserts Chocolate and cocoa. Donuts. Peppermint and spearmint. Fats and Oils High-fat foods, including [place] fries and potato chips. Other Vinegar. Strong spices, such as black pepper, white pepper, red pepper, cayenne, curry powder, cloves, ginger, and chili powder. The items listed above may not be a complete list of foods and beverages to avoid. Contact your dietitian for more information. Document Released: 11/17/2005 Document Revised: 11/22/2013 Document Reviewed: 09/21/2013 [facility] Patient Information 2015 [place], [place]. This information is not intended to replace advice given to you by your health care provider. Make sure you discuss any questions you have with your health care provider.

## 2015-04-09 NOTE — ED Notes (Signed)
Patient states she developed mid chest pain yesterday after eating fried hash browns.  Describes the discomfort as a burning sensation and intermittent.  Has taken otc tums and nexium with minimal relief.

## 2015-10-19 ENCOUNTER — Encounter (HOSPITAL_BASED_OUTPATIENT_CLINIC_OR_DEPARTMENT_OTHER): Payer: Self-pay | Admitting: *Deleted

## 2015-10-19 ENCOUNTER — Emergency Department (HOSPITAL_BASED_OUTPATIENT_CLINIC_OR_DEPARTMENT_OTHER)
Admission: EM | Admit: 2015-10-19 | Discharge: 2015-10-19 | Disposition: A | Discharge status: Home / Self Care | Payer: 59 | Attending: Emergency Medicine | Admitting: Emergency Medicine

## 2015-10-19 DIAGNOSIS — Z8719 Personal history of other diseases of the digestive system: Secondary | ICD-10-CM | POA: Diagnosis not present

## 2015-10-19 DIAGNOSIS — G5712 Meralgia paresthetica, left lower limb: Secondary | ICD-10-CM

## 2015-10-19 DIAGNOSIS — M79605 Pain in left leg: Secondary | ICD-10-CM | POA: Diagnosis present

## 2015-10-19 NOTE — ED Notes (Signed)
Pt reports left upper leg/thigh area pain and numbness x Wednesday. Pt states she sits a lot at work and wonders if that is causing her symptoms. + cms of lle, foot is warm to touch, + pulses. Denies any trauma or injury.

## 2015-11-03 NOTE — ED Provider Notes (Signed)
CSN: [id number]     Arrival date & time 10/19/15  0800 History   First MD Initiated Contact with Patient 10/19/15 [phone]     Chief Complaint  Patient presents with  . Leg Pain     (Consider location/radiation/quality/duration/timing/severity/associated sxs/prior Treatment) HPI   29 year old female with pain and numbness in her left upper/lateral side. Onset on Wednesday. Persistent since then. Denies any trauma. Symptoms are reproducible with palpation over the area. No numbness tingling elsewhere. No loss of strength. No fevers or chills. No rash.  Past Medical History  Diagnosis Date  . Acute cholecystitis 07/26/2012   Past Surgical History  Procedure Laterality Date  . Diagnostic laparoscopy  2007  . Cholecystectomy  07/25/2012    Procedure: LAPAROSCOPIC CHOLECYSTECTOMY WITH INTRAOPERATIVE CHOLANGIOGRAM;  Surgeon: Lodema PilotBrian Layton, DO;  Location: [facility];  Service: General;  Laterality: N/A;  . Umbilical hernia repair  07/25/2012    Procedure: HERNIA REPAIR UMBILICAL ADULT;  Surgeon: Lodema PilotBrian Layton, DO;  Location: [facility];  Service: General;  Laterality: N/A;   History reviewed. No pertinent family history. Social History  Substance Use Topics  . Smoking status: Never Smoker   . Smokeless tobacco: None  . Alcohol Use: Yes   OB History    No data available     Review of Systems  All systems reviewed and negative, other than as noted in HPI.   Allergies  Review of patient's allergies indicates no known allergies.  Home Medications   Prior to Admission medications   Medication Sig Start Date End Date Taking? Authorizing Provider  ibuprofen (ADVIL) 200 MG tablet 2-3 tablets every 6 hours as needed. 07/26/12   Sherrie GeorgeWillard Jennings, PA-C   BP 135/72 mmHg  Pulse 86  Temp(Src) 98.2 F (36.8 C) (Oral)  Resp 20  Ht 5\' 1"  (1.549 m)  Wt 174 lb (78.926 kg)  BMI 32.89 kg/m2  SpO2 100% Physical Exam  Constitutional: She appears well-developed and well-nourished. No distress.  HENT:   Head: Normocephalic and atraumatic.  Eyes: Conjunctivae are normal. Right eye exhibits no discharge. Left eye exhibits no discharge.  Neck: Neck supple.  Cardiovascular: Normal rate, regular rhythm and normal heart sounds.  Exam reveals no gallop and no friction rub.   No murmur heard. Pulmonary/Chest: Effort normal and breath sounds normal. No respiratory distress.  Abdominal: Soft. She exhibits no distension. There is no tenderness.  Musculoskeletal: She exhibits no edema or tenderness.  Neurological: She is alert.  Skin: Skin is warm and dry.  Mild tenderness in complaining of paresthesias with palpation over the proximal/lateral L anterior thigh. No concerning skin changes. Neurovascular intact distally.  Psychiatric: She has a normal mood and affect. Her behavior is normal. Thought content normal.  Nursing note and vitals reviewed.   ED Course  Procedures (including critical care time) Labs Review Labs Reviewed - No data to display  Imaging Review No results found. I have personally reviewed and evaluated these images and lab results as part of my medical decision-making.   EKG Interpretation None      MDM   Final diagnoses:  Meralgia paraesthetica, left    29 year old female with likely meralgia paresthetica. Symptoms and distribution are consistent with this. Reassurance provided. Loose clothing. Can try NSAIDs. Return precautions discussed.    Raeford RazorStephen Haillee Johann, MD 11/03/15 [phone]

## 2016-06-09 ENCOUNTER — Encounter (HOSPITAL_BASED_OUTPATIENT_CLINIC_OR_DEPARTMENT_OTHER): Payer: Self-pay | Admitting: *Deleted

## 2016-06-09 ENCOUNTER — Emergency Department (HOSPITAL_BASED_OUTPATIENT_CLINIC_OR_DEPARTMENT_OTHER)
Admission: EM | Admit: 2016-06-09 | Discharge: 2016-06-09 | Disposition: A | Discharge status: Home / Self Care | Payer: 59 | Attending: Emergency Medicine | Admitting: Emergency Medicine

## 2016-06-09 DIAGNOSIS — K439 Ventral hernia without obstruction or gangrene: Secondary | ICD-10-CM | POA: Insufficient documentation

## 2016-06-09 NOTE — ED Provider Notes (Signed)
CSN: [id number]     Arrival date & time 06/09/16  1191 History   First MD Initiated Contact with Patient 06/09/16 [phone]     Chief Complaint  Patient presents with  . Hernia     (Consider location/radiation/quality/duration/timing/severity/associated sxs/prior Treatment) HPI Comments: Patient with history of ventral/umbilical hernia -- presents with complaint of ventral hernia with associated pain and vomiting. Patient states that she noted that she had a hard lump in the area of her hernia this morning. She was unable to reduce it herself her as usual. Usually the area is soft and easily reducible. Today it was hard and tender. Patient vomited one time. Patient brought herself to the emergency department. During transit, hernia spontaneously reduced. Patient is currently asymptomatic. No residual tenderness. No diarrhea or urinary symptoms. No treatments prior to arrival. Onset of symptoms acute. Course is resolved. Nothing makes symptoms better or worse.  The history is provided by the patient and medical records.    Past Medical History  Diagnosis Date  . Acute cholecystitis 07/26/2012   Past Surgical History  Procedure Laterality Date  . Diagnostic laparoscopy  2007  . Cholecystectomy  07/25/2012    Procedure: LAPAROSCOPIC CHOLECYSTECTOMY WITH INTRAOPERATIVE CHOLANGIOGRAM;  Surgeon: Lodema Pilot, DO;  Location: [facility];  Service: General;  Laterality: N/A;  . Umbilical hernia repair  07/25/2012    Procedure: HERNIA REPAIR UMBILICAL ADULT;  Surgeon: Lodema Pilot, DO;  Location: [facility];  Service: General;  Laterality: N/A;   History reviewed. No pertinent family history. Social History  Substance Use Topics  . Smoking status: Never Smoker   . Smokeless tobacco: None  . Alcohol Use: Yes   OB History    No data available     Review of Systems  Constitutional: Negative for fever.  HENT: Negative for rhinorrhea and sore throat.   Eyes: Negative for redness.  Respiratory: Negative  for cough.   Cardiovascular: Negative for chest pain.  Gastrointestinal: Positive for nausea, vomiting and abdominal pain. Negative for diarrhea.  Genitourinary: Negative for dysuria.  Musculoskeletal: Negative for myalgias.  Skin: Negative for rash.  Neurological: Negative for headaches.      Allergies  Review of patient's allergies indicates no known allergies.  Home Medications   Prior to Admission medications   Medication Sig Start Date End Date Taking? Authorizing Provider  ibuprofen (ADVIL) 200 MG tablet 2-3 tablets every 6 hours as needed. 07/26/12   Sherrie George, PA-C   BP 106/67 mmHg  Pulse 67  Temp(Src) 98 F (36.7 C) (Oral)  Resp 18  Ht 5' (1.524 m)  Wt 74.844 kg  BMI 32.22 kg/m2  SpO2 97%   Physical Exam  Constitutional: She appears well-developed and well-nourished.  HENT:  Head: Normocephalic and atraumatic.  Eyes: Conjunctivae are normal. Right eye exhibits no discharge. Left eye exhibits no discharge.  Neck: Normal range of motion. Neck supple.  Cardiovascular: Normal rate, regular rhythm and normal heart sounds.   Pulmonary/Chest: Effort normal and breath sounds normal.  Abdominal: Soft. There is no tenderness. There is no rebound and no guarding.  No palpable hernia. There is a small abdominal wall defect noted just superior to the umbilicus.   Neurological: She is alert.  Skin: Skin is warm and dry.  Psychiatric: She has a normal mood and affect.  Nursing note and vitals reviewed.   ED Course  Procedures (including critical care time)  9:59 AM Patient seen and examined. Her hernia is reduced and she is currently completely asymptomatic. No  indication for imaging or labs at this point. Encouraged surgery follow-up to discuss hernia. We discussed need to return with incarceration in the future. Patient verbalizes understanding and agrees with plan.  Vital signs reviewed and are as follows: BP 106/67 mmHg  Pulse 67  Temp(Src) 98 F (36.7 C)  (Oral)  Resp 18  Ht 5' (1.524 m)  Wt 74.844 kg  BMI 32.22 kg/m2  SpO2 97%     MDM   Final diagnoses:  Ventral hernia without obstruction or gangrene   Patient presents with what sounds like a transiently incarcerated ventral hernia that resolved and spontaneous reduced upon arrival to the emergency department. Patient did have pain, tenderness, hard lump, and vomiting raising suspicion for obstruction. Given that symptoms are now completely resolved without any residual tenderness or swelling, do not feel that labs or imaging is indicated at this time. Appropriate follow-up as above. Patient educated and counseled on what to do if symptoms return.  Renne CriglerJoshua Candence Sease, PA-C 06/09/16 1001  Melene Planan Floyd, DO 06/09/16 1155

## 2016-06-09 NOTE — ED Notes (Signed)
Umbilical hernia with pain this morning-vomited x 1.  States that she generally reduces hernia herself.  Reports that she attempted to reduce this morning without success.  No visible or palpable hernia noted at this time.  Denies pain at this time.

## 2016-06-09 NOTE — Discharge Instructions (Signed)
Please read and follow all provided instructions.  Your diagnoses today include:  1. Ventral hernia without obstruction or gangrene     Tests performed today include:  Vital signs. See below for your results today.   Medications prescribed:   None  Take any prescribed medications only as directed.  Home care instructions:   Follow any educational materials contained in this packet.  Follow-up instructions: Please follow-up with the surgeon listed in the next week to discuss hernia treatment.    Return instructions:  SEEK IMMEDIATE MEDICAL ATTENTION IF:  The pain does not go away or becomes severe   A temperature above 101F develops   Repeated vomiting occurs (multiple episodes)   The pain becomes localized to portions of the abdomen. The right side could possibly be appendicitis. In an adult, the left lower portion of the abdomen could be colitis or diverticulitis.   Blood is being passed in stools or vomit (bright red or black tarry stools)   You develop chest pain, difficulty breathing, dizziness or fainting, or become confused, poorly responsive, or inconsolable (young children)  If you have any other emergent concerns regarding your health  Additional Information: Abdominal (belly) pain can be caused by many things. Your caregiver performed an examination and possibly ordered blood/urine tests and imaging (CT scan, x-rays, ultrasound). Many cases can be observed and treated at home after initial evaluation in the emergency department. Even though you are being discharged home, abdominal pain can be unpredictable. Therefore, you need a repeated exam if your pain does not resolve, returns, or worsens. Most patients with abdominal pain don't have to be admitted to the hospital or have surgery, but serious problems like appendicitis and gallbladder attacks can start out as nonspecific pain. Many abdominal conditions cannot be diagnosed in one visit, so follow-up evaluations  are very important.  Your vital signs today were: BP 106/67 mmHg   Pulse 67   Temp(Src) 98 F (36.7 C) (Oral)   Resp 18   Ht 5' (1.524 m)   Wt 74.844 kg   BMI 32.22 kg/m2   SpO2 97% If your blood pressure (bp) was elevated above 135/85 this visit, please have this repeated by your doctor within one month. --------------

## 2016-12-02 ENCOUNTER — Encounter (HOSPITAL_BASED_OUTPATIENT_CLINIC_OR_DEPARTMENT_OTHER): Payer: Self-pay | Admitting: Emergency Medicine

## 2016-12-02 DIAGNOSIS — J111 Influenza due to unidentified influenza virus with other respiratory manifestations: Secondary | ICD-10-CM | POA: Diagnosis not present

## 2016-12-02 DIAGNOSIS — R509 Fever, unspecified: Secondary | ICD-10-CM | POA: Diagnosis present

## 2016-12-02 DIAGNOSIS — Z791 Long term (current) use of non-steroidal anti-inflammatories (NSAID): Secondary | ICD-10-CM | POA: Insufficient documentation

## 2016-12-02 MED ORDER — ACETAMINOPHEN 500 MG PO TABS
1000.0000 mg | ORAL_TABLET | Freq: Once | ORAL | Status: AC
  Administered 2016-12-02: 1000 mg via ORAL
  Filled 2016-12-02: qty 2

## 2016-12-02 NOTE — ED Triage Notes (Signed)
Pt reports flu like symptoms sice yesterday. Pt with cough, congestion, and fever at home.

## 2016-12-03 ENCOUNTER — Emergency Department (HOSPITAL_BASED_OUTPATIENT_CLINIC_OR_DEPARTMENT_OTHER)
Admission: EM | Admit: 2016-12-03 | Discharge: 2016-12-03 | Disposition: A | Discharge status: Home / Self Care | Payer: 59 | Attending: Emergency Medicine | Admitting: Emergency Medicine

## 2016-12-03 DIAGNOSIS — J111 Influenza due to unidentified influenza virus with other respiratory manifestations: Secondary | ICD-10-CM

## 2016-12-03 MED ORDER — OSELTAMIVIR PHOSPHATE 75 MG PO CAPS: 75.0000 mg | ORAL_CAPSULE | Freq: Two times a day (BID) | ORAL | 0 refills | Status: DC

## 2016-12-03 MED ORDER — IBUPROFEN 800 MG PO TABS
800.0000 mg | ORAL_TABLET | Freq: Once | ORAL | Status: AC
  Administered 2016-12-03: 800 mg via ORAL
  Filled 2016-12-03: qty 1

## 2016-12-03 MED ORDER — BENZONATATE 100 MG PO CAPS: 100.0000 mg | ORAL_CAPSULE | Freq: Three times a day (TID) | ORAL | 0 refills | Status: DC

## 2016-12-03 NOTE — ED Provider Notes (Signed)
[facility]   CSN: [id number] Arrival date & time: 12/02/16  2316     History   Chief Complaint Chief Complaint  Patient presents with  . URI    HPI Linda Pena is a 31 y.o. female.She presents evaluation of fever and cough.  HPI :  Patient did not receive flu vaccine. Headache sore throat and body aches chills and cough starting yesterday. Intermittent cough during the day. No nausea or vomiting. Does not feel short of breath. No chest pain. Diffuse headache.  Past Medical History:  Diagnosis Date  . Acute cholecystitis 07/26/2012    Patient Active Problem List   Diagnosis Date Noted  . Acute cholecystitis 07/26/2012    Past Surgical History:  Procedure Laterality Date  . CHOLECYSTECTOMY  07/25/2012   Procedure: LAPAROSCOPIC CHOLECYSTECTOMY WITH INTRAOPERATIVE CHOLANGIOGRAM;  Surgeon: Lodema PilotBrian Layton, DO;  Location: [facility];  Service: General;  Laterality: N/A;  . DIAGNOSTIC LAPAROSCOPY  2007  . UMBILICAL HERNIA REPAIR  07/25/2012   Procedure: HERNIA REPAIR UMBILICAL ADULT;  Surgeon: Lodema PilotBrian Layton, DO;  Location: [facility];  Service: General;  Laterality: N/A;    OB History    No data available       Home Medications    Prior to Admission medications   Medication Sig Start Date End Date Taking? Authorizing Provider  benzonatate (TESSALON) 100 MG capsule Take 1 capsule (100 mg total) by mouth every 8 (eight) hours. 12/03/16   Rolland PorterMark Meelah Tallo, MD  ibuprofen (ADVIL) 200 MG tablet 2-3 tablets every 6 hours as needed. 07/26/12   Sherrie GeorgeWillard Jennings, PA-C  oseltamivir (TAMIFLU) 75 MG capsule Take 1 capsule (75 mg total) by mouth 2 (two) times daily. 12/03/16   Rolland PorterMark Larisa Lanius, MD    Family History No family history on file.  Social History Social History  Substance Use Topics  . Smoking status: Never Smoker  . Smokeless tobacco: Never Used  . Alcohol use Yes     Allergies   Patient has no known allergies.   Review of Systems Review of Systems    Constitutional: Positive for chills, fatigue and fever. Negative for appetite change and diaphoresis.  HENT: Positive for sore throat. Negative for mouth sores and trouble swallowing.   Eyes: Negative for visual disturbance.  Respiratory: Positive for cough. Negative for chest tightness, shortness of breath and wheezing.   Cardiovascular: Negative for chest pain.  Gastrointestinal: Negative for abdominal distention, abdominal pain, diarrhea, nausea and vomiting.  Endocrine: Negative for polydipsia, polyphagia and polyuria.  Genitourinary: Negative for dysuria, frequency and hematuria.  Musculoskeletal: Negative for gait problem.  Skin: Negative for color change, pallor and rash.  Neurological: Positive for headaches. Negative for dizziness, syncope and light-headedness.  Hematological: Does not bruise/bleed easily.  Psychiatric/Behavioral: Negative for behavioral problems and confusion.     Physical Exam Updated Vital Signs BP 120/77 (BP Location: Right Arm)   Pulse 110   Temp 102 F (38.9 C) (Oral)   Resp 16   Ht 5' (1.524 m)   Wt 165 lb (74.8 kg)   SpO2 95%   BMI 32.22 kg/m   Physical Exam  Constitutional: She is oriented to person, place, and time. She appears well-developed and well-nourished. No distress.  HENT:  Head: Normocephalic.  Pharynx erythematous but no exudate. Supple neck. No adenopathy.  Eyes: Conjunctivae are normal. Pupils are equal, round, and reactive to light. No scleral icterus.  Neck: Normal range of motion. Neck supple. No thyromegaly present.  Cardiovascular: Normal rate and  regular rhythm.  Exam reveals no gallop and no friction rub.   No murmur heard. Sinus tach 112. Recheck temp 101.  Pulmonary/Chest: Effort normal and breath sounds normal. No respiratory distress. She has no wheezes. She has no rales.  Clear bilateral breath sounds. No increased work of breathing.  Abdominal: Soft. Bowel sounds are normal. She exhibits no distension. There is  no tenderness. There is no rebound.  Musculoskeletal: Normal range of motion.  Neurological: She is alert and oriented to person, place, and time.  Skin: Skin is warm and dry. No rash noted.  Psychiatric: She has a normal mood and affect. Her behavior is normal.     ED Treatments / Results  Labs (all labs ordered are listed, but only abnormal results are displayed) Labs Reviewed - No data to display  EKG  EKG Interpretation None       Radiology No results found.  Procedures Procedures (including critical care time)  Medications Ordered in ED Medications  acetaminophen (TYLENOL) tablet 1,000 mg (1,000 mg Oral Given 12/02/16 2333)  ibuprofen (ADVIL,MOTRIN) tablet 800 mg (800 mg Oral Given 12/03/16 0055)     Initial Impression / Assessment and Plan / ED Course  I have reviewed the triage vital signs and the nursing notes.  Pertinent labs & imaging results that were available during my care of the patient were reviewed by me and considered in my medical decision making (see chart for details).  Clinical Course     Addendum findings consistent with influenza. No concerns regarding sepsis or pneumonia. Nontoxic in her appearance. We'll plan Tamiflu Tessalon rest fluids and antipyretics.  Final Clinical Impressions(s) / ED Diagnoses   Final diagnoses:  Influenza    New Prescriptions New Prescriptions   BENZONATATE (TESSALON) 100 MG CAPSULE    Take 1 capsule (100 mg total) by mouth every 8 (eight) hours.   OSELTAMIVIR (TAMIFLU) 75 MG CAPSULE    Take 1 capsule (75 mg total) by mouth 2 (two) times daily.     Rolland Porter, MD 12/03/16 [phone]

## 2020-03-14 ENCOUNTER — Emergency Department (HOSPITAL_BASED_OUTPATIENT_CLINIC_OR_DEPARTMENT_OTHER)
Admission: EM | Admit: 2020-03-14 | Discharge: 2020-03-14 | Disposition: A | Discharge status: Home / Self Care | Payer: 59 | Attending: Emergency Medicine | Admitting: Emergency Medicine

## 2020-03-14 ENCOUNTER — Other Ambulatory Visit: Payer: Self-pay

## 2020-03-14 ENCOUNTER — Encounter (HOSPITAL_BASED_OUTPATIENT_CLINIC_OR_DEPARTMENT_OTHER): Payer: Self-pay

## 2020-03-14 DIAGNOSIS — Z4803 Encounter for change or removal of drains: Secondary | ICD-10-CM | POA: Diagnosis present

## 2020-03-14 DIAGNOSIS — Z9049 Acquired absence of other specified parts of digestive tract: Secondary | ICD-10-CM | POA: Insufficient documentation

## 2020-03-14 DIAGNOSIS — Z5189 Encounter for other specified aftercare: Secondary | ICD-10-CM

## 2020-03-14 NOTE — ED Provider Notes (Signed)
[facility] EMERGENCY DEPARTMENT Provider Note   CSN: [id number] Arrival date & time: 03/14/20  1329     History Chief Complaint  Patient presents with  . Post-op Problem    Linda Pena is a 34 y.o. female who presents for evaluation of postop evaluation.  Patient reports that she got a tummy tuck done in [place] on 03/01/2020.  She states that she had a JP drain inserted.  They told her when she got home to follow-up with her primary care doctor to remove the JP drain.  She went and saw her primary care doctor who told her that they would not remove it and to go to the emergency department instead.  Patient reports that she was prescribed Augmentin which she has finished.  She states that the surgical incision site has been healing with no drainage.  She has not noted any fever.  She reports that the JP drain has had minimal amount of drainage.  She denies any fevers, pain.  The history is provided by the patient.       Past Medical History:  Diagnosis Date  . Acute cholecystitis 07/26/2012    Patient Active Problem List   Diagnosis Date Noted  . Acute cholecystitis 07/26/2012    Past Surgical History:  Procedure Laterality Date  . ABDOMINOPLASTY    . CHOLECYSTECTOMY  07/25/2012   Procedure: LAPAROSCOPIC CHOLECYSTECTOMY WITH INTRAOPERATIVE CHOLANGIOGRAM;  Surgeon: Lodema Pilot, DO;  Location: [facility];  Service: General;  Laterality: N/A;  . DIAGNOSTIC LAPAROSCOPY  2007  . UMBILICAL HERNIA REPAIR  07/25/2012   Procedure: HERNIA REPAIR UMBILICAL ADULT;  Surgeon: Lodema Pilot, DO;  Location: [facility];  Service: General;  Laterality: N/A;     OB History   No obstetric history on file.     No family history on file.  Social History   Tobacco Use  . Smoking status: Never Smoker  . Smokeless tobacco: Never Used  Substance Use Topics  . Alcohol use: Yes    Comment: occ  . Drug use: No    Home Medications Prior to Admission medications   Medication Sig Start  Date End Date Taking? Authorizing Provider  ibuprofen (ADVIL) 200 MG tablet 2-3 tablets every 6 hours as needed. 07/26/12   Sherrie George, PA-C  albuterol (PROVENTIL) (2.5 MG/3ML) 0.083% nebulizer solution Take 2.5 mg by nebulization every 6 (six) hours as needed. For cough or wheeze  02/10/12  [provider]  omeprazole (PRILOSEC) 40 MG capsule Take 1 capsule (40 mg total) by mouth daily. 04/09/15 04/09/15  Ward, Layla Maw, DO    Allergies    Patient has no known allergies.  Review of Systems   Review of Systems  Constitutional: Negative for fever.  Skin: Positive for wound.  All other systems reviewed and are negative.   Physical Exam Updated Vital Signs BP 120/73 (BP Location: Left Arm)   Pulse 86   Temp 98.2 F (36.8 C) (Oral)   Resp 16   Ht 5\' 1"  (1.549 m)   Wt 71.7 kg   SpO2 100%   BMI 29.85 kg/m   Physical Exam Vitals and nursing note reviewed.  Constitutional:      Appearance: She is well-developed.  HENT:     Head: Normocephalic and atraumatic.  Eyes:     General: No scleral icterus.       Right eye: No discharge.        Left eye: No discharge.     Conjunctiva/sclera: Conjunctivae normal.  Pulmonary:     Effort: Pulmonary effort is normal.  Skin:    General: Skin is warm and dry.     Comments: Surgical incision site noted to the lower abdomen.  No overlying warmth, erythema.  No active drainage.  She has a JP drain noted in the left lower quadrant with small amount of bloody drainage.  Neurological:     Mental Status: She is alert.  Psychiatric:        Speech: Speech normal.        Behavior: Behavior normal.     ED Results / Procedures / Treatments   Labs (all labs ordered are listed, but only abnormal results are displayed) Labs Reviewed - No data to display  EKG None  Radiology No results found.  Procedures Procedures (including critical care time)  Medications Ordered in ED Medications - No data to display  ED Course  I have  reviewed the triage vital signs and the nursing notes.  Pertinent labs & imaging results that were available during my care of the patient were reviewed by me and considered in my medical decision making (see chart for details).    MDM Rules/Calculators/A&P                      34 year old female who presents for evaluation of postop evaluation.  Recent tummy tuck on 03/01/2020 in [place].  Had JP drain inserted.  Was told to follow-up with her primary care doctor regarding JP drain removal.  Patient saw her primary care doctor and was told to go the emergency department.  No fevers, drainage from the wounds.  On initial ED arrival, she is afebrile, nontoxic-appearing.  Vital signs are stable.  She has a JP drain in the left lower quadrant with some minimal bloody drainage.  No purulent drainage noted.  Surgical incision site looks well with no signs of active infection, warmth, erythema, active drainage.  I discussed with patient that she will need outpatient general surgery follow-up for removal of JP drain.  At this time, do not feel that emergent removal of the JP drain is indicated. At this time, patient exhibits no emergent life-threatening condition that require further evaluation in ED. Patient had ample opportunity for questions and discussion. All patient's questions were answered with full understanding. Strict return precautions discussed. Patient expresses understanding and agreement to plan.   Portions of this note were generated with [employer]. Dictation errors may occur despite best attempts at proofreading.  Final Clinical Impression(s) / ED Diagnoses Final diagnoses:  Visit for wound check    Rx / DC Orders ED Discharge Orders    None       Volanda Napoleon, PA-C 03/14/20 1505    Little, Wenda Overland, MD 03/15/20 0710

## 2020-03-14 NOTE — ED Triage Notes (Signed)
Pt states she had a "tummy tuck in [place] 4/1"-was advised to follow up with PCP to have JP drain removed-states her PCP advised her it was out of their scope and to to got to UC or ED-NAD-steady gait

## 2020-03-14 NOTE — Discharge Instructions (Signed)
Call the surgeon's office for an outpatient appointment for further evaluation of your JP drain removal.  Return the emergency department for fever, drainage from your wound or any other worsening or concerning symptoms.

## 2020-05-05 ENCOUNTER — Other Ambulatory Visit: Payer: Self-pay

## 2020-05-05 ENCOUNTER — Encounter (HOSPITAL_BASED_OUTPATIENT_CLINIC_OR_DEPARTMENT_OTHER): Payer: Self-pay | Admitting: Emergency Medicine

## 2020-05-05 ENCOUNTER — Emergency Department (HOSPITAL_BASED_OUTPATIENT_CLINIC_OR_DEPARTMENT_OTHER)
Admission: EM | Admit: 2020-05-05 | Discharge: 2020-05-05 | Disposition: A | Discharge status: Home / Self Care | Payer: 59 | Attending: Emergency Medicine | Admitting: Emergency Medicine

## 2020-05-05 DIAGNOSIS — Z5321 Procedure and treatment not carried out due to patient leaving prior to being seen by health care provider: Secondary | ICD-10-CM | POA: Diagnosis not present

## 2020-05-05 DIAGNOSIS — M542 Cervicalgia: Secondary | ICD-10-CM | POA: Insufficient documentation

## 2020-05-05 NOTE — ED Triage Notes (Signed)
R side neck pain since when she woke up on Sunday. Taking ibuprofen and muscle relaxers without relief.

## 2022-01-24 ENCOUNTER — Encounter (HOSPITAL_BASED_OUTPATIENT_CLINIC_OR_DEPARTMENT_OTHER): Payer: Self-pay | Admitting: Emergency Medicine

## 2022-01-24 ENCOUNTER — Emergency Department (HOSPITAL_BASED_OUTPATIENT_CLINIC_OR_DEPARTMENT_OTHER)
Admission: EM | Admit: 2022-01-24 | Discharge: 2022-01-24 | Disposition: A | Discharge status: Home / Self Care | Payer: 59 | Attending: Emergency Medicine | Admitting: Emergency Medicine

## 2022-01-24 ENCOUNTER — Emergency Department (HOSPITAL_BASED_OUTPATIENT_CLINIC_OR_DEPARTMENT_OTHER): Payer: 59

## 2022-01-24 ENCOUNTER — Other Ambulatory Visit: Payer: Self-pay

## 2022-01-24 DIAGNOSIS — R071 Chest pain on breathing: Secondary | ICD-10-CM | POA: Insufficient documentation

## 2022-01-24 DIAGNOSIS — R079 Chest pain, unspecified: Secondary | ICD-10-CM | POA: Diagnosis present

## 2022-01-24 LAB — CBC
HCT: 37.6 % (ref 36.0–46.0)
Hemoglobin: 12.5 g/dL (ref 12.0–15.0)
MCH: 28.8 pg (ref 26.0–34.0)
MCHC: 33.2 g/dL (ref 30.0–36.0)
MCV: 86.6 fL (ref 80.0–100.0)
Platelets: 318 10*3/uL (ref 150–400)
RBC: 4.34 MIL/uL (ref 3.87–5.11)
RDW: 13.3 % (ref 11.5–15.5)
WBC: 9.3 10*3/uL (ref 4.0–10.5)
nRBC: 0 % (ref 0.0–0.2)

## 2022-01-24 LAB — BASIC METABOLIC PANEL
Anion gap: 5 (ref 5–15)
BUN: 10 mg/dL (ref 6–20)
CO2: 25 mmol/L (ref 22–32)
Calcium: 8.4 mg/dL — ABNORMAL LOW (ref 8.9–10.3)
Chloride: 105 mmol/L (ref 98–111)
Creatinine, Ser: 0.83 mg/dL (ref 0.44–1.00)
GFR, Estimated: >60 mL/min (ref >60)
Glucose, Bld: 91 mg/dL (ref 70–99)
Potassium: 3.9 mmol/L (ref 3.5–5.1)
Sodium: 135 mmol/L (ref 135–145)

## 2022-01-24 LAB — PREGNANCY, URINE: Preg Test, Ur: NEGATIVE

## 2022-01-24 LAB — TROPONIN I (HIGH SENSITIVITY): Troponin I (High Sensitivity): <2 ng/L (ref <18)

## 2022-01-24 NOTE — ED Notes (Signed)
ED Provider at bedside. 

## 2022-01-24 NOTE — Discharge Instructions (Addendum)
You were seen in the emergency department today for pain in your chest.  While you are here we did an x-ray, EKG and lab work which was all reassuring.  Please follow-up with your primary care provider or urgent care if you continue to have the same pain.  It may be beneficial for you to take some ibuprofen in the next week or so to reduce any inflammation.  Return to the emergency department for significantly worsening chest pain that radiates to your arm, jaw or associated with nausea or sweating.  Please also return for significantly worsening shortness of breath.

## 2022-01-24 NOTE — ED Triage Notes (Signed)
Pt presents to ED Pov. Pt c/o mid CP that only occurs with deep breath. Pt reports that pain began yesterday and when it occurs it's a 7/10. Describes pain as aching. Denies cardiac hx

## 2022-01-24 NOTE — ED Provider Notes (Signed)
[facility] EMERGENCY DEPARTMENT Provider Note   CSN: [id number] Arrival date & time: 01/24/22  9628     History  Chief Complaint  Patient presents with   Chest Pain    Linda Pena is a 36 y.o. female. With no pertinent past medical history who presents to the emergency department with chest pain.  Patient states that beginning yesterday she began having pain with deep inspiration. She states that the chest pain is central and non-radiating. Describes it as a "tightness" that is only present with deep inspiration and is not there otherwise. No associated symptoms such as jaw pain, arm pain, back pain, nausea or diaphoresis. She denies palpitations, shortness of breath, lower extremity swelling, lightheadedness or dizziness. Denies cough, fever, upper respiratory symptoms. She denies recent travel or immobilization, tobacco. Has progesterone only IUD. No history of DVT/PE. She denies trauma to the chest.    Chest Pain Associated symptoms: no cough, no fatigue, no fever, no nausea, no palpitations and no shortness of breath       Home Medications Prior to Admission medications   Medication Sig Start Date End Date Taking? Authorizing Provider  ibuprofen (ADVIL) 200 MG tablet 2-3 tablets every 6 hours as needed. 07/26/12   Sherrie George, PA-C  albuterol (PROVENTIL) (2.5 MG/3ML) 0.083% nebulizer solution Take 2.5 mg by nebulization every 6 (six) hours as needed. For cough or wheeze  02/10/12  [provider]  omeprazole (PRILOSEC) 40 MG capsule Take 1 capsule (40 mg total) by mouth daily. 04/09/15 04/09/15  Ward, Layla Maw, DO      Allergies    Patient has no known allergies.    Review of Systems   Review of Systems  Constitutional:  Negative for fatigue and fever.  Respiratory:  Positive for chest tightness. Negative for cough and shortness of breath.   Cardiovascular:  Positive for chest pain. Negative for palpitations and leg swelling.  Gastrointestinal:   Negative for nausea.  All other systems reviewed and are negative.  Physical Exam Updated Vital Signs BP 121/79 (BP Location: Left Arm)    Pulse 75    Temp 98.2 F (36.8 C) (Oral)    Resp 18    Ht 5\' 1"  (1.549 m)    Wt 80.3 kg    SpO2 97%    BMI 33.44 kg/m  Physical Exam Vitals and nursing note reviewed.  Constitutional:      General: She is not in acute distress.    Appearance: Normal appearance. She is well-developed. She is not ill-appearing or toxic-appearing.  HENT:     Head: Normocephalic and atraumatic.     Nose: Nose normal.     Mouth/Throat:     Mouth: Mucous membranes are moist.     Pharynx: Oropharynx is clear.  Eyes:     General: No scleral icterus.    Extraocular Movements: Extraocular movements intact.  Neck:     Vascular: No JVD.  Cardiovascular:     Rate and Rhythm: Normal rate and regular rhythm.     Pulses:          Radial pulses are 2+ on the right side and 2+ on the left side.       Dorsalis pedis pulses are 2+ on the right side and 2+ on the left side.     Heart sounds: Normal heart sounds. No murmur heard. Pulmonary:     Effort: Pulmonary effort is normal. No tachypnea or respiratory distress.     Breath sounds: Normal breath  sounds. No wheezing, rhonchi or rales.  Chest:     Chest wall: No tenderness.  Abdominal:     General: Bowel sounds are normal. There is no distension.     Palpations: Abdomen is soft.     Tenderness: There is no abdominal tenderness.  Musculoskeletal:        General: Normal range of motion.     Cervical back: Normal range of motion and neck supple.     Right lower leg: No tenderness. No edema.     Left lower leg: No tenderness. No edema.  Skin:    General: Skin is warm and dry.     Capillary Refill: Capillary refill takes less than 2 seconds.  Neurological:     General: No focal deficit present.     Mental Status: She is alert and oriented to person, place, and time. Mental status is at baseline.  Psychiatric:        Mood  and Affect: Mood normal.        Behavior: Behavior normal.        Thought Content: Thought content normal.        Judgment: Judgment normal.    ED Results / Procedures / Treatments   Labs (all labs ordered are listed, but only abnormal results are displayed) Labs Reviewed  BASIC METABOLIC PANEL - Abnormal; Notable for the following components:      Result Value   Calcium 8.4 (*)    All other components within normal limits  CBC  PREGNANCY, URINE  TROPONIN I (HIGH SENSITIVITY)   EKG EKG Interpretation  Date/Time:  Friday January 24 2022 09:25:06 EST Ventricular Rate:  77 PR Interval:  154 QRS Duration: 72 QT Interval:  366 QTC Calculation: 414 R Axis:   38 Text Interpretation: Normal sinus rhythm Cannot rule out Anterior infarct , age undetermined Abnormal ECG When compared with ECG of 09-Apr-2015 12:46, PREVIOUS ECG IS PRESENT No significant change since last tracing Confirmed by Jacalyn Lefevre [phone]) on 01/24/2022 9:30:40 AM  Radiology DG Chest 2 View  Result Date: 01/24/2022 CLINICAL DATA:  36 year old female with history of chest pain during deep inspiration. EXAM: CHEST - 2 VIEW COMPARISON:  Chest x-ray 04/09/2015. FINDINGS: Lung volumes are normal. No consolidative airspace disease. No pleural effusions. No pneumothorax. No pulmonary nodule or mass noted. Pulmonary vasculature and the cardiomediastinal silhouette are within normal limits. Surgical clips project over the right upper quadrant of the abdomen, likely from prior cholecystectomy. IMPRESSION: No radiographic evidence of acute cardiopulmonary disease. Electronically Signed   By: Trudie Reed M.D.   On: 01/24/2022 09:51    Procedures Procedures   Medications Ordered in ED Medications - No data to display  ED Course/ Medical Decision Making/ A&P                           Medical Decision Making Amount and/or Complexity of Data Reviewed Labs: ordered. Radiology: ordered.  Patient presents to the ED  with complaints of chest pain. This involves an extensive number of treatment options, and is a complaint that carries with it a high risk of complications and morbidity.   Additional history obtained:  Additional history obtained from: none External records from outside source obtained and reviewed including: previous primary care visits   EKG: EKG: normal EKG, normal sinus rhythm.   Cardiac Monitoring: The patient was maintained on a cardiac monitor.  I personally viewed and interpreted the cardiac monitored which showed  an underlying rhythm of: sinus rhythm   Lab Results: I personally ordered, reviewed, and interpreted labs. Pertinent results include: CBC within normal limits BMP within normal limits  Urine pregnancy negative  Troponin x1 negative   Imaging Studies ordered:  I ordered imaging studies which included x-ray.  I independently reviewed & interpreted imaging & am in agreement with radiology impression. Imaging shows: CXR with no cardiopulmonary process   ED Course: 37 year old female who presents to the emergency department with chest pain.  Exam without evidence of volume overload so doubt heart failure.  EKG without signs of active ischemia.  Given timing of pain to ER presentation, single troponin <2, will not pursue second troponin, doubt NSTEMI.  Presentation is not consistent with an acute PE.  She is PERC 0, Wells low risk, will not pursue D-dimer at this time.  Chest x-ray without evidence of pneumothorax.  Her presentation is not consistent with aortic dissection, pericarditis, tamponade.  Chest x-ray without pneumonia she also has no infectious symptoms such as cough or fever.  No evidence of myocarditis as no recent illnesses, negative troponin. HEAR Score: 0    Unclear etiology of her chest tightness with deep inspiration.  Could be costochondritis.  I am unable to replicate her symptoms with palpation.  Overall she is nontoxic in appearance and well-appearing.   She is in no acute respiratory distress.  Oxygenating well on room air.  She does have primary care and I have suggested her follow-up with primary care in the coming weeks to ensure her symptoms have abated.  Also discussed that she could use some ibuprofen over the next coming days to see if this possibly treat symptoms (if possibly costochondritis) and she verbalized understanding.  After consideration of the diagnostic results and the patients response to treatment, I feel that the patent would benefit from discharge. The patient has been appropriately medically screened and/or stabilized in the ED. I have low suspicion for any other emergent medical condition which would require further screening, evaluation or treatment in the ED or require inpatient management. The patient is overall well appearing and non-toxic in appearance. They are hemodynamically stable at time of discharge.   Final Clinical Impression(s) / ED Diagnoses Final diagnoses:  Chest pain on breathing    Rx / DC Orders ED Discharge Orders     None         Cristopher Peru, PA-C 01/24/22 1033    Jacalyn Lefevre, MD 01/24/22 1152

## 2023-06-26 IMAGING — DX DG CHEST 2V
2 series · 2 of 2 positions shown · non-contrast
Comparison: Chest x-ray 04/09/2015.

CLINICAL DATA: 35-year-old female with history of chest pain during
deep inspiration.

EXAM:
CHEST - 2 VIEW

[chest pa]
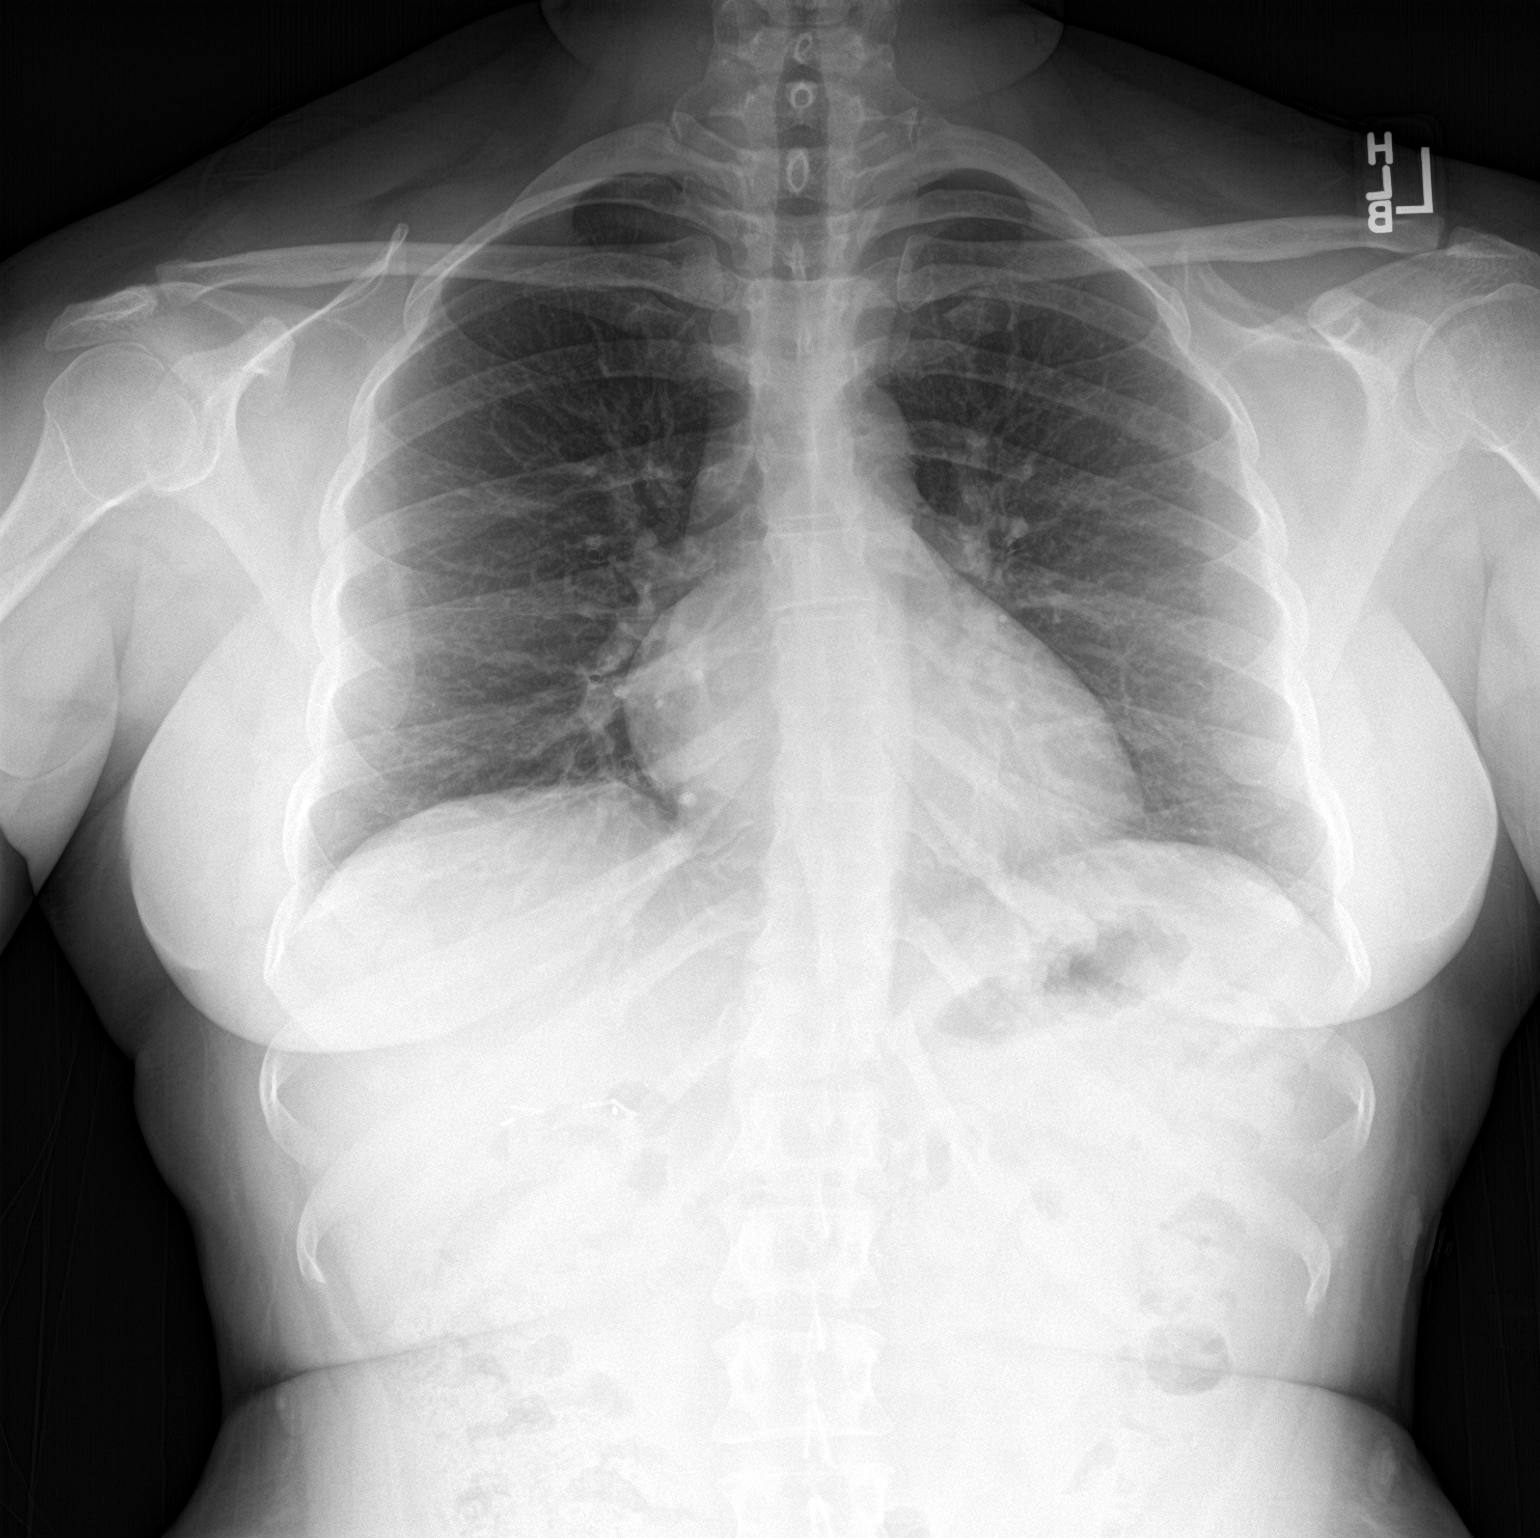

[chest lat]
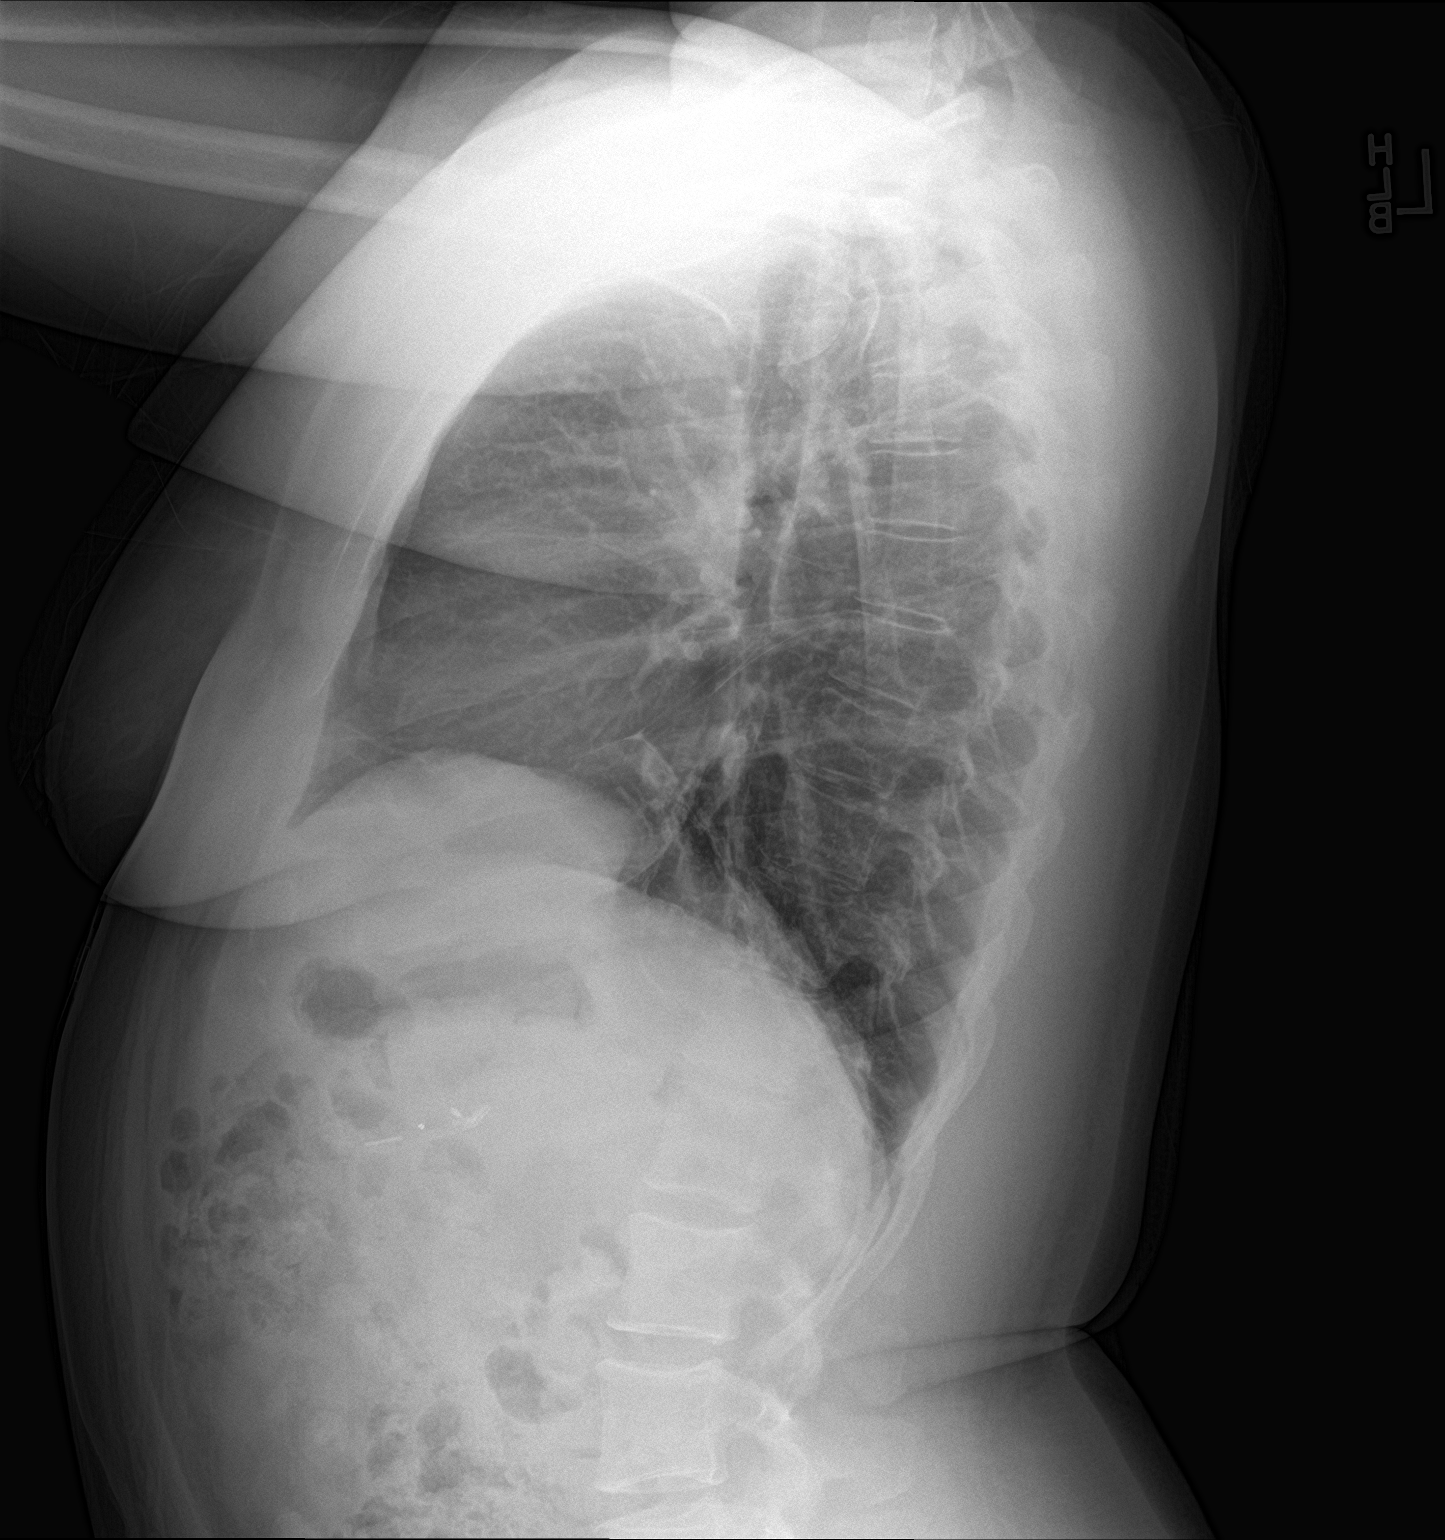

[2 of 2 positions shown; findings below may reference images not displayed]

FINDINGS: Lung volumes are normal. No consolidative airspace disease. No
pleural effusions. No pneumothorax. No pulmonary nodule or mass
noted. Pulmonary vasculature and the cardiomediastinal silhouette
are within normal limits. Surgical clips project over the right
upper quadrant of the abdomen, likely from prior cholecystectomy.
IMPRESSION: No radiographic evidence of acute cardiopulmonary disease.
